# Patient Record
Sex: Female | Born: 1985 | Race: Black or African American | Hispanic: No | Marital: Single | Smoking: Current some day smoker
Health system: Southern US, Community
[De-identification: ages and names within clinical notes are randomized; demographics above are authoritative.]

---

## 2006-08-05 ENCOUNTER — Emergency Department (HOSPITAL_COMMUNITY): Admission: EM | Admit: 2006-08-05 | Discharge: 2006-08-05 | Payer: Self-pay | Admitting: Emergency Medicine

## 2016-06-21 ENCOUNTER — Emergency Department (HOSPITAL_COMMUNITY)
Admission: EM | Admit: 2016-06-21 | Discharge: 2016-06-21 | Disposition: A | Discharge status: Home / Self Care | Payer: Self-pay | Attending: Emergency Medicine | Admitting: Emergency Medicine

## 2016-06-21 ENCOUNTER — Ambulatory Visit (HOSPITAL_COMMUNITY)
Admission: EM | Admit: 2016-06-21 | Discharge: 2016-06-21 | Disposition: A | Discharge status: Nursing Home (Includes ICF) | Payer: Self-pay

## 2016-06-21 ENCOUNTER — Encounter (HOSPITAL_COMMUNITY): Payer: Self-pay | Admitting: Emergency Medicine

## 2016-06-21 DIAGNOSIS — R109 Unspecified abdominal pain: Secondary | ICD-10-CM

## 2016-06-21 DIAGNOSIS — R1084 Generalized abdominal pain: Secondary | ICD-10-CM | POA: Insufficient documentation

## 2016-06-21 LAB — CBC
HEMATOCRIT: 35 % — AB (ref 36.0–46.0)
HEMOGLOBIN: 10.7 g/dL — AB (ref 12.0–15.0)
MCH: 24.5 pg — AB (ref 26.0–34.0)
MCHC: 30.6 g/dL (ref 30.0–36.0)
MCV: 80.3 fL (ref 78.0–100.0)
Platelets: 398 10*3/uL (ref 150–400)
RBC: 4.36 MIL/uL (ref 3.87–5.11)
RDW: 14.8 % (ref 11.5–15.5)
WBC: 7.4 10*3/uL (ref 4.0–10.5)

## 2016-06-21 LAB — COMPREHENSIVE METABOLIC PANEL
ALBUMIN: 4.5 g/dL (ref 3.5–5.0)
ALT: 22 U/L (ref 14–54)
ANION GAP: 10 (ref 5–15)
AST: 22 U/L (ref 15–41)
Alkaline Phosphatase: 48 U/L (ref 38–126)
BUN: 15 mg/dL (ref 6–20)
CHLORIDE: 105 mmol/L (ref 101–111)
CO2: 23 mmol/L (ref 22–32)
Calcium: 9.2 mg/dL (ref 8.9–10.3)
Creatinine, Ser: 0.62 mg/dL (ref 0.44–1.00)
GFR calc Af Amer: >60 mL/min (ref >60)
GFR calc non Af Amer: >60 mL/min (ref >60)
GLUCOSE: 107 mg/dL — AB (ref 65–99)
POTASSIUM: 3.6 mmol/L (ref 3.5–5.1)
SODIUM: 138 mmol/L (ref 135–145)
TOTAL PROTEIN: 7.7 g/dL (ref 6.5–8.1)
Total Bilirubin: 0.9 mg/dL (ref 0.3–1.2)

## 2016-06-21 LAB — I-STAT CHEM 8, ED
BUN: 21 mg/dL — AB (ref 6–20)
CREATININE: 0.6 mg/dL (ref 0.44–1.00)
Calcium, Ion: 1.1 mmol/L — ABNORMAL LOW (ref 1.13–1.30)
Chloride: 104 mmol/L (ref 101–111)
Glucose, Bld: 98 mg/dL (ref 65–99)
HEMATOCRIT: 40 % (ref 36.0–46.0)
Hemoglobin: 13.6 g/dL (ref 12.0–15.0)
Potassium: 4.5 mmol/L (ref 3.5–5.1)
Sodium: 140 mmol/L (ref 135–145)
TCO2: 26 mmol/L (ref 0–100)

## 2016-06-21 LAB — LIPASE, BLOOD: Lipase: 17 U/L (ref 11–51)

## 2016-06-21 MED ORDER — HYDROCODONE-ACETAMINOPHEN 5-325 MG PO TABS
1.0000 | ORAL_TABLET | Freq: Once | ORAL | Status: DC
  Filled 2016-06-21: qty 1

## 2016-06-21 MED ORDER — BISMUTH SUBSALICYLATE 262 MG PO CHEW
524.0000 mg | CHEWABLE_TABLET | ORAL | Status: DC | PRN
  Filled 2016-06-21: qty 2

## 2016-06-21 MED ORDER — ONDANSETRON 4 MG PO TBDP: 4.0000 mg | ORAL_TABLET | Freq: Three times a day (TID) | ORAL | 0 refills | Status: DC | PRN

## 2016-06-21 MED ORDER — ONDANSETRON HCL 8 MG PO TABS: 8.0000 mg | ORAL_TABLET | Freq: Three times a day (TID) | ORAL | 0 refills | Status: DC | PRN

## 2016-06-21 MED ORDER — SODIUM CHLORIDE 0.9 % IV BOLUS (SEPSIS)
1000.0000 mL | Freq: Once | INTRAVENOUS | Status: AC
  Administered 2016-06-21: 1000 mL via INTRAVENOUS

## 2016-06-21 MED ORDER — METRONIDAZOLE IVPB CUSTOM
2.0000 g | Freq: Once | INTRAVENOUS | Status: AC
  Administered 2016-06-21: 2 g via INTRAVENOUS
  Filled 2016-06-21 (×2): qty 400

## 2016-06-21 MED ORDER — IBUPROFEN 800 MG PO TABS: 800.0000 mg | ORAL_TABLET | Freq: Four times a day (QID) | ORAL | 0 refills | Status: DC | PRN

## 2016-06-21 MED ORDER — ACETAMINOPHEN 500 MG PO TABS
1000.0000 mg | ORAL_TABLET | Freq: Once | ORAL | Status: AC
  Administered 2016-06-21: 1000 mg via ORAL
  Filled 2016-06-21: qty 2

## 2016-06-21 MED ORDER — BISMUTH SUBSALICYLATE 262 MG PO CHEW: CHEWABLE_TABLET | ORAL | 0 refills | Status: DC

## 2016-06-21 MED ORDER — ACETAMINOPHEN 500 MG PO TABS: 1000.0000 mg | ORAL_TABLET | Freq: Four times a day (QID) | ORAL | 0 refills | Status: DC | PRN

## 2016-06-21 MED ORDER — KETOROLAC TROMETHAMINE 15 MG/ML IJ SOLN
15.0000 mg | Freq: Once | INTRAMUSCULAR | Status: AC
  Administered 2016-06-21: 15 mg via INTRAVENOUS
  Filled 2016-06-21: qty 1

## 2016-06-21 MED ORDER — ONDANSETRON 4 MG PO TBDP
8.0000 mg | ORAL_TABLET | Freq: Once | ORAL | Status: AC
  Administered 2016-06-21: 8 mg via ORAL
  Filled 2016-06-21: qty 2

## 2016-06-21 NOTE — ED Triage Notes (Signed)
Patient reports abdominal pain Sunday 8/5 and seen at [facility] obtaining labs and ct scan.  Patient reports she was given pepcid and phenergan,  Patient reports she is getting worse, no better at all.  Reports vomiting prior to coming in today.  Last stool was yesterday and diarrhea.    See one pelvic testing from 8/2 positive trich, but patient has not been on any medicines  Denies any urinary symptoms and denies any vaginal discharge.

## 2016-06-21 NOTE — ED Provider Notes (Signed)
[facility] Provider Note   CSN: [id number] Arrival date & time: 06/21/16  1307  First Provider Contact:  First MD Initiated Contact with Patient 06/21/16 1429     History   Chief Complaint Chief Complaint  Patient presents with  . Abdominal Pain    HPI Tammy Patel is a 30 y.o. female.  HPI   Patient presents with abdominal pain. Began four days ago and has been worsening since. Was seen at [facility] ED two days ago for same problem. hCG neg, CT with signs of pelvic venous congestion but no abnormalities otherwise, labs WNL. Patient discharged with Pepcid and phenergan, however symptoms did not improve. She was seen at Urgent Care this AM, and was told to come to [facility] ED.  Reporting about 7 episodes of emesis daily and two episodes of diarrhea. Also endorsing nausea. Unable to tolerate solids, but has been able to drink water without vomiting. Has not tried anything other than phenergan for symptoms. Denies constipation, hematochezia, chest pain, SOB.    Of note, patient was seen by PCP on 8/2 and wet prep at that visit was positive for trichomoniasis. Patient denies knowledge of this and says she did not receive any antibiotics. She denies vaginal discharge, vaginal irritation, hematuria.   History reviewed. No pertinent past medical history.  There are no active problems to display for this patient.  Past Surgical History:  Procedure Laterality Date  . CESAREAN SECTION      OB History    No data available      Home Medications    Prior to Admission medications   Medication Sig Start Date End Date Taking? Authorizing Provider  promethazine (PHENERGAN) 25 MG tablet Take 25 mg by mouth every 6 (six) hours as needed for nausea or vomiting.   Yes Historical Provider, MD  acetaminophen (TYLENOL) 500 MG tablet Take 2 tablets (1,000 mg total) by mouth every 6 (six) hours as needed. 06/21/16   Marquette Saa, MD  bismuth subsalicylate (PEPTO-BISMOL) 262 MG  chewable tablet Chew 2 tablets (524 mg total) by mouth as needed for diarrhea/loose stools. 06/21/16   Marquette Saa, MD  ibuprofen (ADVIL,MOTRIN) 800 MG tablet Take 1 tablet (800 mg total) by mouth every 6 (six) hours as needed. 06/21/16   Marquette Saa, MD  ondansetron (ZOFRAN ODT) 4 MG disintegrating tablet Take 1 tablet (4 mg total) by mouth every 8 (eight) hours as needed for nausea or vomiting. 06/21/16   Marquette Saa, MD    Family History No family history on file.  Social History Social History  Substance Use Topics  . Smoking status: Never Smoker  . Smokeless tobacco: Never Used  . Alcohol use Yes     Allergies   Review of patient's allergies indicates no known allergies.   Review of Systems Review of Systems  Constitutional: Positive for chills. Negative for appetite change and fever.  HENT: Negative for congestion and rhinorrhea.   Respiratory: Negative for chest tightness and shortness of breath.   Cardiovascular: Negative for chest pain.  Gastrointestinal: Positive for abdominal pain, diarrhea, nausea and vomiting. Negative for blood in stool and constipation.  Genitourinary: Negative for hematuria, pelvic pain, vaginal discharge and vaginal pain.  Allergic/Immunologic: Negative for immunocompromised state.  Neurological: Positive for dizziness and weakness.    Physical Exam Updated Vital Signs BP 142/89   Pulse 85   Temp 98 F (36.7 C) (Oral)   Resp 18   Ht 5' (1.524 m)  Wt 49.9 kg   LMP 06/17/2016   SpO2 100%   BMI 21.48 kg/m   Physical Exam  Constitutional: She is oriented to person, place, and time.  Lying in bed in mild distress  HENT:  Head: Normocephalic and atraumatic.  Nose: Nose normal.  Mouth/Throat: No oropharyngeal exudate.  Dry MM, cracked lips  Eyes: Conjunctivae and EOM are normal. Pupils are equal, round, and reactive to light. Right eye exhibits no discharge. Left eye exhibits no discharge. No scleral  icterus.  Neck: Normal range of motion. Neck supple.  Cardiovascular: Normal rate, regular rhythm, normal heart sounds and intact distal pulses.   No murmur heard. Pulmonary/Chest: Effort normal and breath sounds normal. No respiratory distress. She has no wheezes.  Abdominal: Soft. Bowel sounds are normal. She exhibits no distension and no mass. There is tenderness (Diffuse). There is guarding. There is no rebound.  Musculoskeletal: She exhibits no edema or tenderness.  Lymphadenopathy:    She has no cervical adenopathy.  Neurological: She is alert and oriented to person, place, and time.  Skin: Skin is warm and dry.  Psychiatric: Her behavior is normal.  Flat affect   ED Treatments / Results  Labs (all labs ordered are listed, but only abnormal results are displayed) Labs Reviewed  COMPREHENSIVE METABOLIC PANEL - Abnormal; Notable for the following:       Result Value   Glucose, Bld 107 (*)    All other components within normal limits  CBC - Abnormal; Notable for the following:    Hemoglobin 10.7 (*)    HCT 35.0 (*)    MCH 24.5 (*)    All other components within normal limits  I-STAT CHEM 8, ED - Abnormal; Notable for the following:    BUN 21 (*)    Calcium, Ion 1.10 (*)    All other components within normal limits  LIPASE, BLOOD  URINALYSIS, ROUTINE W REFLEX MICROSCOPIC (NOT AT [facility])    EKG  EKG Interpretation None       Radiology No results found.  Procedures Procedures (including critical care time)  Medications Ordered in ED Medications  bismuth subsalicylate (PEPTO BISMOL) chewable tablet 524 mg (not administered)  metroNIDAZOLE (FLAGYL) IVPB 2 g (2 g Intravenous New Bag/Given 06/21/16 1529)  ondansetron (ZOFRAN-ODT) disintegrating tablet 8 mg (8 mg Oral Given 06/21/16 1455)  sodium chloride 0.9 % bolus 1,000 mL (1,000 mLs Intravenous New Bag/Given 06/21/16 1528)  ketorolac (TORADOL) 15 MG/ML injection 15 mg (15 mg Intravenous Given 06/21/16 1527)  acetaminophen  (TYLENOL) tablet 1,000 mg (1,000 mg Oral Given 06/21/16 1528)    Initial Impression / Assessment and Plan / ED Course  I have reviewed the triage vital signs and the nursing notes.  Pertinent labs & imaging results that were available during my care of the patient were reviewed by me and considered in my medical decision making (see chart for details).  Clinical Course   - Given Zofran ODT, Pepto Bismol, Toradol, and Tylenol with some improvement in symptoms. Also given Flagyl to treat trichomoniasis.   Final Clinical Impressions(s) / ED Diagnoses   Final diagnoses:  Generalized abdominal pain   Patient presenting with diffuse abdominal pain. As patient with CT negative for acute abnormalities performed two days ago, no repeat imaging today. Beta-hCG negative two days ago as well, so not repeated today. Given vomiting, diarrhea, and chills, likely 2/2 gastroenteritis. Improved with Zofran, Pepto Bismol, and NSAIDs. Discharged with Zofran ODT, Pepto Bismol, Tylenol and ibuprofen, and instructed to drink  as much fluids as possible.   New Prescriptions New Prescriptions   ACETAMINOPHEN (TYLENOL) 500 MG TABLET    Take 2 tablets (1,000 mg total) by mouth every 6 (six) hours as needed.   BISMUTH SUBSALICYLATE (PEPTO-BISMOL) 262 MG CHEWABLE TABLET    Chew 2 tablets (524 mg total) by mouth as needed for diarrhea/loose stools.   IBUPROFEN (ADVIL,MOTRIN) 800 MG TABLET    Take 1 tablet (800 mg total) by mouth every 6 (six) hours as needed.   ONDANSETRON (ZOFRAN ODT) 4 MG DISINTEGRATING TABLET    Take 1 tablet (4 mg total) by mouth every 8 (eight) hours as needed for nausea or vomiting.     Marquette Saa, MD 06/21/16 1626    Lyndal Pulley, MD 06/21/16 2152

## 2016-06-21 NOTE — ED Notes (Signed)
Pt reports that she still feels bad

## 2016-06-21 NOTE — ED Provider Notes (Addendum)
[facility]    CSN: [id number] Arrival date & time: 06/21/16  1159  First Provider Contact:  First MD Initiated Contact with Patient 06/21/16 1239        History   Chief Complaint No chief complaint on file.   HPI Tammy Patel is a 30 y.o. female.    Abdominal Pain  Pain location:  Generalized Pain quality: sharp   Pain radiates to:  Does not radiate Pain severity:  Moderate Onset quality:  Gradual Timing:  Constant Progression:  Worsening Chronicity:  New Context comment:  Seen at [facility] on mon, ct and lab neg, given pepcid and phenergan with no relief, constant nausea and vomiting episodes, no fever, pains getting worse., denies vaginal or menstrual sx. no urinary sx. Associated symptoms: diarrhea, nausea and vomiting   Associated symptoms: no dysuria, no fever, no hematochezia, no vaginal bleeding and no vaginal discharge   Risk factors: not pregnant     No past medical history on file.  There are no active problems to display for this patient.   No past surgical history on file.  OB History    No data available       Home Medications    Prior to Admission medications   Not on File    Family History No family history on file.  Social History Social History  Substance Use Topics  . Smoking status: Not on file  . Smokeless tobacco: Not on file  . Alcohol use Not on file     Allergies   Review of patient's allergies indicates not on file.   Review of Systems Review of Systems  Constitutional: Negative.  Negative for fever.  HENT: Negative.   Respiratory: Negative.   Cardiovascular: Negative.   Gastrointestinal: Positive for abdominal pain, diarrhea, nausea and vomiting. Negative for hematochezia.  Genitourinary: Negative for dysuria, vaginal bleeding and vaginal discharge.  All other systems reviewed and are negative.    Physical Exam Triage Vital Signs ED Triage Vitals [06/21/16 1226]  Enc Vitals Group     BP  127/68     Pulse Rate 89     Resp 12     Temp 98.1 F (36.7 C)     Temp Source Oral     SpO2 97 %     Weight      Height      Head Circumference      Peak Flow      Pain Score      Pain Loc      Pain Edu?      Excl. in GC?    No data found.   Updated Vital Signs BP 127/68 (BP Location: Left Arm)   Pulse 89   Temp 98.1 F (36.7 C) (Oral)   Resp 12   SpO2 97%   Visual Acuity Right Eye Distance:   Left Eye Distance:   Bilateral Distance:    Right Eye Near:   Left Eye Near:    Bilateral Near:     Physical Exam  Constitutional: She is oriented to person, place, and time. She appears well-developed and well-nourished. She appears distressed.  Cardiovascular: Normal rate and regular rhythm.   Pulmonary/Chest: Effort normal and breath sounds normal.  Abdominal: Soft. Normal appearance. She exhibits no mass. Bowel sounds are decreased. There is generalized tenderness and tenderness in the right lower quadrant, epigastric area, periumbilical area and left lower quadrant. There is no rigidity, no rebound, no guarding and no CVA tenderness. No  hernia.  Lymphadenopathy:    She has no cervical adenopathy.  Neurological: She is alert and oriented to person, place, and time.  Skin: Skin is warm and dry.  Nursing note and vitals reviewed.    UC Treatments / Results  Labs (all labs ordered are listed, but only abnormal results are displayed) Labs Reviewed - No data to display  EKG  EKG Interpretation None       Radiology No results found.  Procedures Procedures (including critical care time)  Medications Ordered in UC Medications - No data to display   Initial Impression / Assessment and Plan / UC Course  I have reviewed the triage vital signs and the nursing notes.  Pertinent labs & imaging results that were available during my care of the patient were reviewed by me and considered in my medical decision making (see chart for details).  Clinical Course    sent for eval of gen abd pain, seen at [facility] with full neg w/u on mon, appears in mod discomfort.    Final Clinical Impressions(s) / UC Diagnoses   Final diagnoses:  Abdominal pain in female patient    New Prescriptions New Prescriptions   No medications on file     Linna HoffJames D Lulu Hirschmann, MD 06/21/16 1258    Linna HoffJames D Aaliyha Mumford, MD 06/21/16 1300

## 2016-06-21 NOTE — ED Notes (Signed)
PT comfortable with d/c and f/u instructions. Rx x1 

## 2016-06-21 NOTE — ED Triage Notes (Addendum)
N/v/d since Sunday , lmp last day was Monday  Lower abd pain also since Sunday , [facility] and given meds not helping , phenergan and pepcid

## 2016-06-21 NOTE — ED Notes (Signed)
[facility] by Selena BattenKim RN sts patient c/o abdominal pain n/v/d. [facility] with negative CT scan. Pt in NAD.

## 2016-06-21 NOTE — ED Notes (Signed)
Waiting for   Iv fluid to infuse

## 2016-06-21 NOTE — Discharge Instructions (Signed)
For your abdominal pain, you can alternate between ibuprofen 800 mg and Tylenol 1000 mg (two tablets) every 4-6 hours as needed.   If you are nauseated or vomiting, you can take one Zofran tablet every 8 hours as needed.   For diarrhea or loose stools, you can take Pepto-Bismol as needed.

## 2016-06-21 NOTE — ED Notes (Signed)
Report given to Ricard DillonElizabeth, rn at first nurse in emergency department

## 2016-06-22 LAB — CBG MONITORING, ED: GLUCOSE-CAPILLARY: 108 mg/dL — AB (ref 65–99)

## 2017-01-30 ENCOUNTER — Emergency Department (HOSPITAL_COMMUNITY)
Admission: EM | Admit: 2017-01-30 | Discharge: 2017-01-30 | Disposition: A | Discharge status: Home / Self Care | Payer: Self-pay | Attending: Emergency Medicine | Admitting: Emergency Medicine

## 2017-01-30 ENCOUNTER — Encounter (HOSPITAL_COMMUNITY): Payer: Self-pay | Admitting: *Deleted

## 2017-01-30 ENCOUNTER — Emergency Department (HOSPITAL_COMMUNITY): Payer: Self-pay

## 2017-01-30 DIAGNOSIS — R109 Unspecified abdominal pain: Secondary | ICD-10-CM

## 2017-01-30 DIAGNOSIS — R1012 Left upper quadrant pain: Secondary | ICD-10-CM | POA: Insufficient documentation

## 2017-01-30 DIAGNOSIS — R112 Nausea with vomiting, unspecified: Secondary | ICD-10-CM | POA: Insufficient documentation

## 2017-01-30 DIAGNOSIS — Z79899 Other long term (current) drug therapy: Secondary | ICD-10-CM | POA: Insufficient documentation

## 2017-01-30 DIAGNOSIS — F17200 Nicotine dependence, unspecified, uncomplicated: Secondary | ICD-10-CM | POA: Insufficient documentation

## 2017-01-30 LAB — COMPREHENSIVE METABOLIC PANEL
ALBUMIN: 3.9 g/dL (ref 3.5–5.0)
ALT: 27 U/L (ref 14–54)
AST: 31 U/L (ref 15–41)
Alkaline Phosphatase: 49 U/L (ref 38–126)
Anion gap: 13 (ref 5–15)
BUN: 6 mg/dL (ref 6–20)
CHLORIDE: 101 mmol/L (ref 101–111)
CO2: 25 mmol/L (ref 22–32)
Calcium: 9 mg/dL (ref 8.9–10.3)
Creatinine, Ser: 0.66 mg/dL (ref 0.44–1.00)
GFR calc Af Amer: >60 mL/min (ref >60)
GFR calc non Af Amer: >60 mL/min (ref >60)
GLUCOSE: 111 mg/dL — AB (ref 65–99)
POTASSIUM: 3 mmol/L — AB (ref 3.5–5.1)
SODIUM: 139 mmol/L (ref 135–145)
Total Bilirubin: 0.5 mg/dL (ref 0.3–1.2)
Total Protein: 6.6 g/dL (ref 6.5–8.1)

## 2017-01-30 LAB — URINALYSIS, ROUTINE W REFLEX MICROSCOPIC
BILIRUBIN URINE: NEGATIVE
Glucose, UA: NEGATIVE mg/dL
Hgb urine dipstick: NEGATIVE
KETONES UR: 5 mg/dL — AB
LEUKOCYTES UA: NEGATIVE
Nitrite: NEGATIVE
PH: 8 (ref 5.0–8.0)
Protein, ur: NEGATIVE mg/dL
SPECIFIC GRAVITY, URINE: 1.011 (ref 1.005–1.030)

## 2017-01-30 LAB — CBC
HEMATOCRIT: 30.6 % — AB (ref 36.0–46.0)
HEMOGLOBIN: 9.3 g/dL — AB (ref 12.0–15.0)
MCH: 24.1 pg — ABNORMAL LOW (ref 26.0–34.0)
MCHC: 30.4 g/dL (ref 30.0–36.0)
MCV: 79.3 fL (ref 78.0–100.0)
Platelets: 423 10*3/uL — ABNORMAL HIGH (ref 150–400)
RBC: 3.86 MIL/uL — ABNORMAL LOW (ref 3.87–5.11)
RDW: 14.7 % (ref 11.5–15.5)
WBC: 7.2 10*3/uL (ref 4.0–10.5)

## 2017-01-30 LAB — LIPASE, BLOOD: LIPASE: 15 U/L (ref 11–51)

## 2017-01-30 LAB — RAPID URINE DRUG SCREEN, HOSP PERFORMED
Amphetamines: NOT DETECTED
BARBITURATES: NOT DETECTED
Benzodiazepines: NOT DETECTED
COCAINE: NOT DETECTED
Opiates: NOT DETECTED
TETRAHYDROCANNABINOL: POSITIVE — AB

## 2017-01-30 LAB — PREGNANCY, URINE: Preg Test, Ur: NEGATIVE

## 2017-01-30 LAB — POC URINE PREG, ED: Preg Test, Ur: NEGATIVE

## 2017-01-30 MED ORDER — ONDANSETRON HCL 4 MG/2ML IJ SOLN
4.0000 mg | Freq: Once | INTRAMUSCULAR | Status: AC
  Administered 2017-01-30: 4 mg via INTRAVENOUS
  Filled 2017-01-30: qty 2

## 2017-01-30 MED ORDER — POTASSIUM CHLORIDE 20 MEQ/15ML (10%) PO SOLN: 20.0000 meq | Freq: Every day | ORAL | 0 refills | Status: DC

## 2017-01-30 MED ORDER — SODIUM CHLORIDE 0.9 % IV BOLUS (SEPSIS)
1000.0000 mL | Freq: Once | INTRAVENOUS | Status: AC
  Administered 2017-01-30: 1000 mL via INTRAVENOUS

## 2017-01-30 MED ORDER — IOPAMIDOL (ISOVUE-300) INJECTION 61%
INTRAVENOUS | Status: AC
  Administered 2017-01-30: 100 mL
  Filled 2017-01-30: qty 100

## 2017-01-30 MED ORDER — FENTANYL CITRATE (PF) 100 MCG/2ML IJ SOLN
100.0000 ug | INTRAMUSCULAR | Status: DC | PRN
  Administered 2017-01-30: 100 ug via INTRAVENOUS
  Filled 2017-01-30: qty 2

## 2017-01-30 MED ORDER — ONDANSETRON HCL 8 MG PO TABS: 8.0000 mg | ORAL_TABLET | Freq: Three times a day (TID) | ORAL | 0 refills | Status: AC | PRN

## 2017-01-30 NOTE — ED Notes (Signed)
UDS added on with lab.

## 2017-01-30 NOTE — ED Notes (Signed)
ED Provider at bedside. Explaining discharge instructions.

## 2017-01-30 NOTE — ED Triage Notes (Signed)
c/o nausea and vomiting for several weeks has been seen several times in different ED's for same

## 2017-01-30 NOTE — ED Notes (Signed)
Pt verbalized understanding of discharge instructions and denies any further questions at this time.   

## 2017-01-30 NOTE — ED Provider Notes (Signed)
[facility] Provider Note   CSN: [id number] Arrival date & time: 01/30/17  0720     History   Chief Complaint Chief Complaint  Patient presents with  . Emesis    HPI Tammy Patel is a 31 y.o. female.  She is here for evaluation of nausea vomiting with diarrhea for about 10 days.  She was previously evaluated, with labs and treated symptomatically, without improvement.  She has been using both Zofran and Phenergan, intermittently.  She denies fever cough weakness or dizziness.  No prior similar problem.  There are no other known modifying factors.  HPI  History reviewed. No pertinent past medical history.  There are no active problems to display for this patient.   Past Surgical History:  Procedure Laterality Date  . CESAREAN SECTION      OB History    Gravida Para Term Preterm AB Living   4 4       4    SAB TAB Ectopic Multiple Live Births                   Home Medications    Prior to Admission medications   Medication Sig Start Date End Date Taking? Authorizing Provider  famotidine (PEPCID) 20 MG tablet Take 20 mg by mouth 2 (two) times daily.   Yes Historical Provider, MD  promethazine (PHENERGAN) 25 MG tablet Take 25 mg by mouth every 6 (six) hours as needed for nausea or vomiting.   Yes Historical Provider, MD  acetaminophen (TYLENOL) 500 MG tablet Take 2 tablets (1,000 mg total) by mouth every 6 (six) hours as needed. Patient not taking: Reported on 01/30/2017 06/21/16   Marquette Saa, MD  bismuth subsalicylate (PEPTO-BISMOL) 262 MG chewable tablet Chew 2 tablets (524 mg total) by mouth as needed for diarrhea/loose stools. Patient not taking: Reported on 01/30/2017 06/21/16   Marquette Saa, MD  ibuprofen (ADVIL,MOTRIN) 800 MG tablet Take 1 tablet (800 mg total) by mouth every 6 (six) hours as needed. Patient not taking: Reported on 01/30/2017 06/21/16   Marquette Saa, MD  ondansetron (ZOFRAN ODT) 4 MG disintegrating tablet  Take 1 tablet (4 mg total) by mouth every 8 (eight) hours as needed for nausea or vomiting. Patient not taking: Reported on 01/30/2017 06/21/16   Marquette Saa, MD  ondansetron [facility]) 8 MG tablet Take 1 tablet (8 mg total) by mouth every 8 (eight) hours as needed for nausea or vomiting. 01/30/17   Mancel Bale, MD    Family History History reviewed. No pertinent family history.  Social History Social History  Substance Use Topics  . Smoking status: Current Some Day Smoker  . Smokeless tobacco: Never Used  . Alcohol use No     Allergies   Patient has no known allergies.   Review of Systems Review of Systems  All other systems reviewed and are negative.    Physical Exam Updated Vital Signs BP (!) 158/104 (BP Location: Left Arm)   Pulse 89   Temp 98.4 F (36.9 C) (Oral)   Resp 16   LMP 01/22/2017   SpO2 100%   Physical Exam  Constitutional: She is oriented to person, place, and time. She appears well-developed and well-nourished.  HENT:  Head: Normocephalic and atraumatic.  Eyes: Conjunctivae and EOM are normal. Pupils are equal, round, and reactive to light.  Neck: Normal range of motion and phonation normal. Neck supple.  Cardiovascular: Normal rate and regular rhythm.   Pulmonary/Chest: Effort normal and breath sounds  normal. She exhibits no tenderness.  Abdominal: Soft. She exhibits no distension and no mass. There is tenderness (Tender left upper lower quadrants, mild). There is no rebound and no guarding. No hernia.  Musculoskeletal: Normal range of motion.  Neurological: She is alert and oriented to person, place, and time. She exhibits normal muscle tone.  Skin: Skin is warm and dry.  Psychiatric: She has a normal mood and affect. Her behavior is normal. Judgment and thought content normal.  Nursing note and vitals reviewed.    ED Treatments / Results  Labs (all labs ordered are listed, but only abnormal results are displayed) Labs Reviewed    COMPREHENSIVE METABOLIC PANEL - Abnormal; Notable for the following:       Result Value   Potassium 3.0 (*)    Glucose, Bld 111 (*)    All other components within normal limits  CBC - Abnormal; Notable for the following:    RBC 3.86 (*)    Hemoglobin 9.3 (*)    HCT 30.6 (*)    MCH 24.1 (*)    Platelets 423 (*)    All other components within normal limits  URINALYSIS, ROUTINE W REFLEX MICROSCOPIC - Abnormal; Notable for the following:    Color, Urine STRAW (*)    Ketones, ur 5 (*)    All other components within normal limits  RAPID URINE DRUG SCREEN, HOSP PERFORMED - Abnormal; Notable for the following:    Tetrahydrocannabinol POSITIVE (*)    All other components within normal limits  LIPASE, BLOOD  PREGNANCY, URINE  POC URINE PREG, ED    EKG  EKG Interpretation None       Radiology Ct Abdomen Pelvis W Contrast  Result Date: 01/30/2017 CLINICAL DATA:  31 year old female with nausea and vomiting for several weeks. EXAM: CT ABDOMEN AND PELVIS WITH CONTRAST TECHNIQUE: Multidetector CT imaging of the abdomen and pelvis was performed using the standard protocol following bolus administration of intravenous contrast. CONTRAST:  100mL ISOVUE-300 IOPAMIDOL (ISOVUE-300) INJECTION 61% COMPARISON:  CT the abdomen and pelvis 06/19/2017. FINDINGS: Lower chest: Unremarkable. Hepatobiliary: No cystic or solid hepatic lesions. No intra or extrahepatic biliary ductal dilatation. Gallbladder is normal in appearance. Pancreas: No pancreatic mass. No pancreatic ductal dilatation. No pancreatic or peripancreatic fluid or inflammatory changes. Spleen: Unremarkable. Adrenals/Urinary Tract: Bilateral adrenal glands and bilateral kidneys are normal in appearance. There is no hydroureteronephrosis. Urinary bladder is normal in appearance. Stomach/Bowel: Normal appearance of the stomach. No pathologic dilatation of small bowel or colon. Normal appendix. Vascular/Lymphatic: No significant atherosclerotic  disease, aneurysm or dissection identified in the abdominal or pelvic vasculature. No lymphadenopathy noted in the abdomen or pelvis. Reproductive: Uterus and ovaries are unremarkable in appearance. Other: Trace volume of free fluid noted in the cul-de-sac, presumably physiologic in this young female patient. No larger volume of ascites. No pneumoperitoneum. Musculoskeletal: There are no aggressive appearing lytic or blastic lesions noted in the visualized portions of the skeleton. IMPRESSION: 1. No acute findings are noted in the abdomen or pelvis. 2. Normal appendix. 3. Trace volume of free fluid in the cul-de-sac, presumably physiologic in this young female patient. Electronically Signed   By: Trudie Reedaniel  Entrikin M.D.   On: 01/30/2017 12:38    Procedures Procedures (including critical care time)  Medications Ordered in ED Medications  sodium chloride 0.9 % bolus 1,000 mL (0 mLs Intravenous Stopped 01/30/17 1055)  ondansetron (ZOFRAN) injection 4 mg (4 mg Intravenous Given 01/30/17 1006)  iopamidol (ISOVUE-300) 61 % injection (100 mLs  Contrast  Given 01/30/17 1214)     Initial Impression / Assessment and Plan / ED Course  I have reviewed the triage vital signs and the nursing notes.  Pertinent labs & imaging results that were available during my care of the patient were reviewed by me and considered in my medical decision making (see chart for details).  Clinical Course as of Jan 31 1743  Tue Jan 30, 2017  0918 Ongoing nausea vomiting with limited diarrhea for several weeks.  Previous evaluation at [facility] emergency department, 2.  No better with antiemetics.  [EW]    Clinical Course User Index [EW] Mancel Bale, MD    Medications  sodium chloride 0.9 % bolus 1,000 mL (0 mLs Intravenous Stopped 01/30/17 1055)  ondansetron (ZOFRAN) injection 4 mg (4 mg Intravenous Given 01/30/17 1006)  iopamidol (ISOVUE-300) 61 % injection (100 mLs  Contrast Given 01/30/17 1214)    Patient  Vitals for the past 24 hrs:  BP Temp Temp src Pulse Resp SpO2  01/30/17 1233 (!) 158/104 98.4 F (36.9 C) Oral 89 16 100 %  01/30/17 0947 (!) 148/102 - - 92 16 100 %  01/30/17 0724 (!) 153/101 98.2 F (36.8 C) Oral 87 16 100 %    At discharge- reevaluation with update and discussion. After initial assessment and treatment, an updated evaluation reveals she is comfortable and has no further complaints.  Findings discussed with patient, all questions answered. Cru Kritikos L    Final Clinical Impressions(s) / ED Diagnoses   Final diagnoses:  Non-intractable vomiting with nausea, unspecified vomiting type  Abdominal pain, unspecified abdominal location    Nonspecific ongoing nausea vomiting and diarrhea, nonspecific, without significant abnormal findings.  Urine drug screen positive for THC.  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  Mild blood pressure elevation is likely secondary to discomfort.  Doubt hypertensive urgency.  Doubt impending vascular clamps were metabolic instability.  She will need evaluation by gastroenterology for definitive diagnosis.  Nursing Notes Reviewed/ Care Coordinated Applicable Imaging Reviewed Interpretation of Laboratory Data incorporated into ED treatment  The patient appears reasonably screened and/or stabilized for discharge and I doubt any other medical condition or other [facility] requiring further screening, evaluation, or treatment in the ED at this time prior to discharge.  Plan: Home Medications-resume Zofran or Phenergan as needed for nausea vomiting; Home Treatments-rest, fluids gradually advance diet; return here if the recommended treatment, does not improve the symptoms; Recommended follow up-gastroenterology checkup 1 week   New Prescriptions Discharge Medication List as of 01/30/2017  2:04 PM       Mancel Bale, MD 01/30/17 1747

## 2017-01-30 NOTE — ED Notes (Signed)
Patient transported to CT 

## 2017-11-20 IMAGING — CT CT ABD-PELV W/ CM
2 of 4 series · 16 of 46 positions shown, 18 images · IV contrast (Omni 300)
Comparison: CT the abdomen and pelvis 06/19/2017.

CLINICAL DATA: 30-year-old female with nausea and vomiting for
several weeks.

EXAM:
CT ABDOMEN AND PELVIS WITH CONTRAST
TECHNIQUE: Multidetector CT imaging of the abdomen and pelvis was performed
using the standard protocol following bolus administration of
intravenous contrast.
CONTRAST:  100mL DZJU56-JLL IOPAMIDOL (DZJU56-JLL) INJECTION 61%

[Series 3: a/p w/ 5mm · axial · 0.62mm/px · z∈[-996,-621]mm · 13 of 83 slices shown, 15 images]
[im 4/83  soft-tissue]
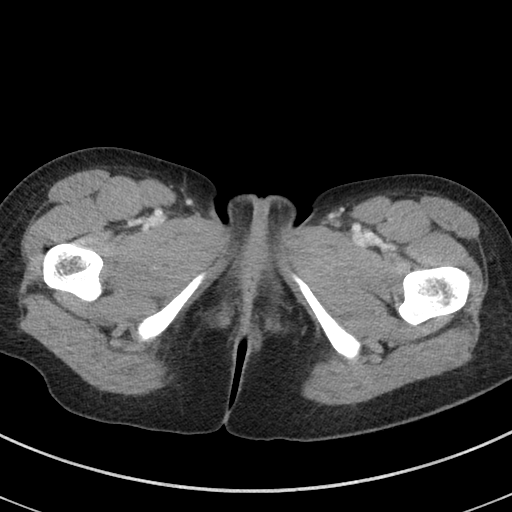
[im 4/83  bone]
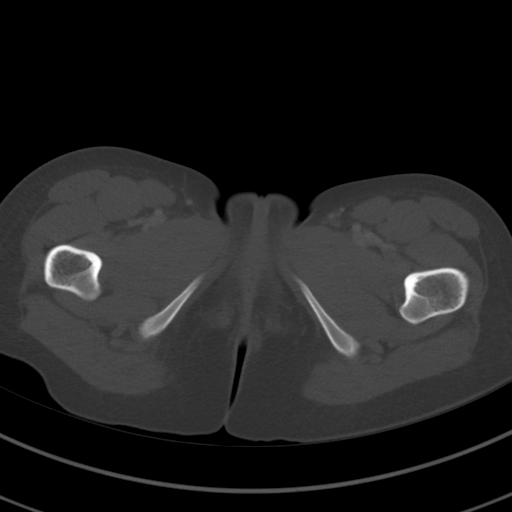
[im 11/83  soft-tissue]
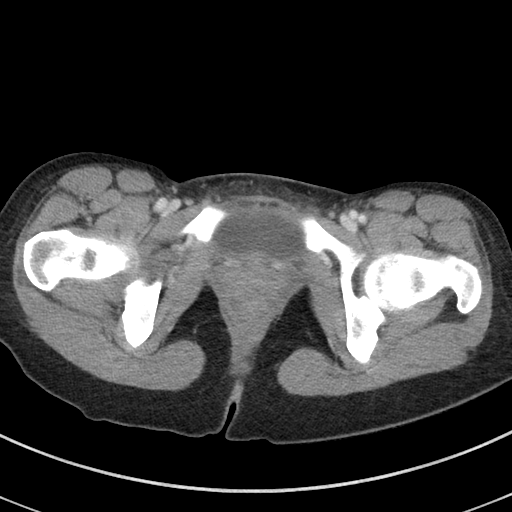
[im 18/83  soft-tissue]
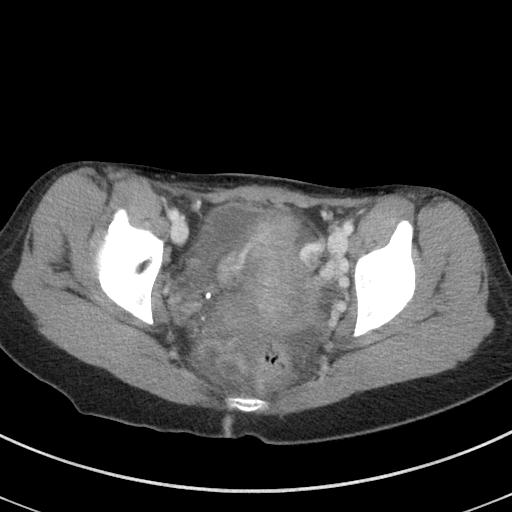
[im 22/83  soft-tissue]
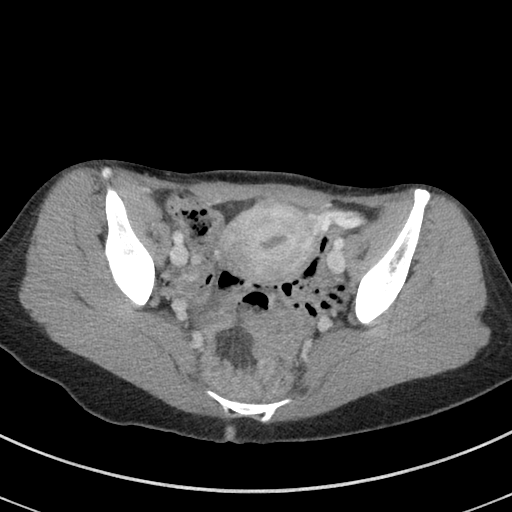
[im 29/83  soft-tissue]
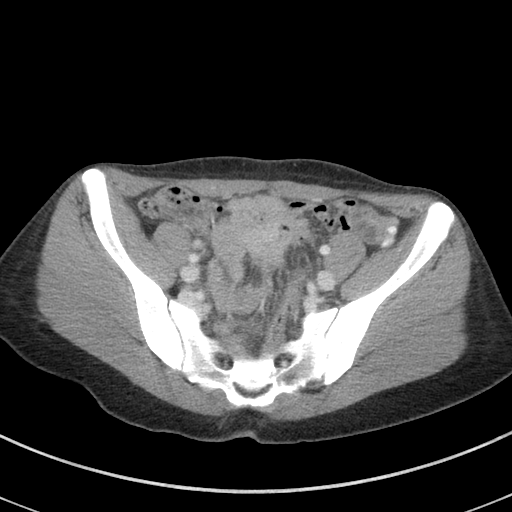
[im 36/83  soft-tissue]
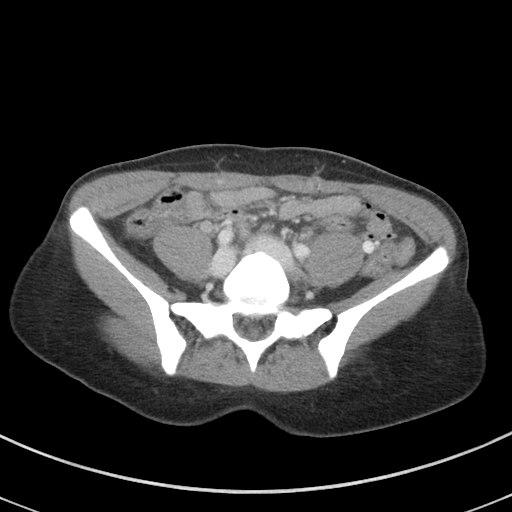
[im 43/83  soft-tissue]
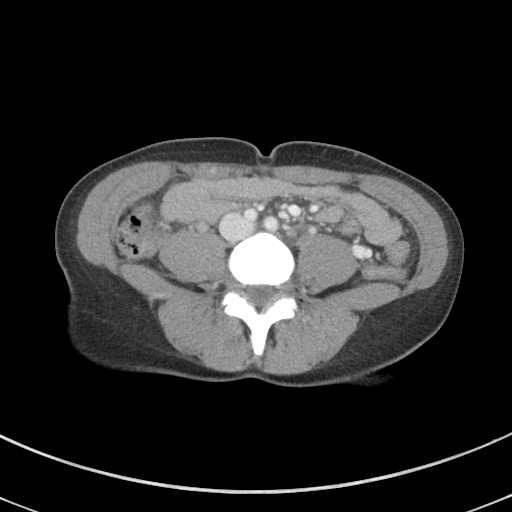
[im 47/83  soft-tissue]
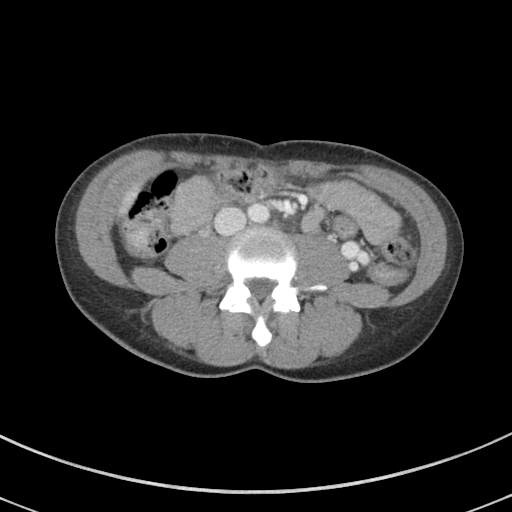
[im 54/83  soft-tissue]
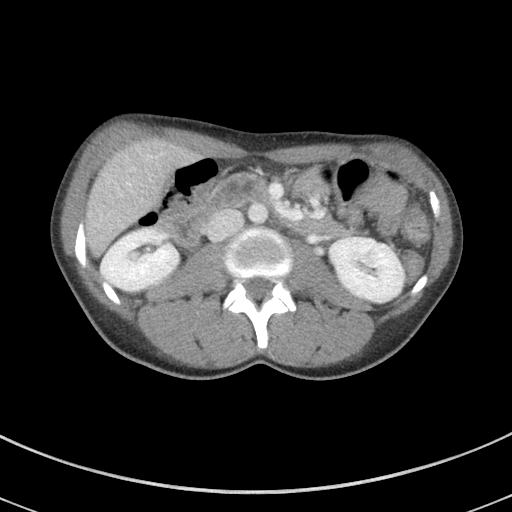
[im 54/83  bone]
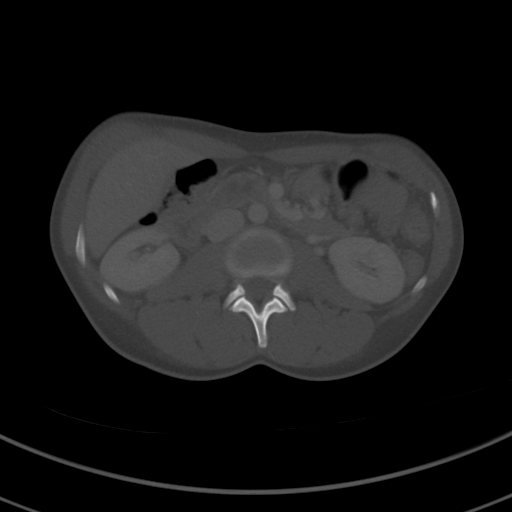
[im 61/83  soft-tissue]
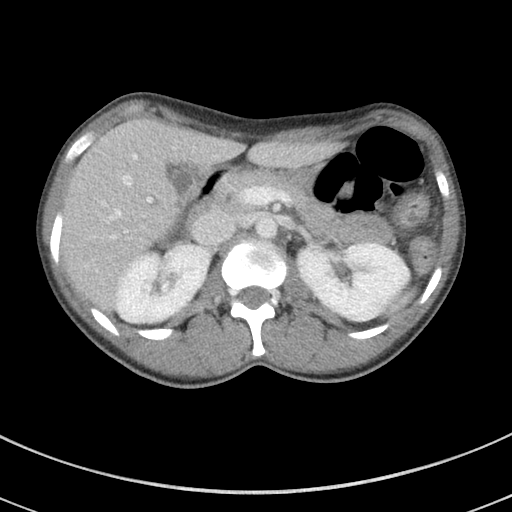
[im 65/83  soft-tissue]
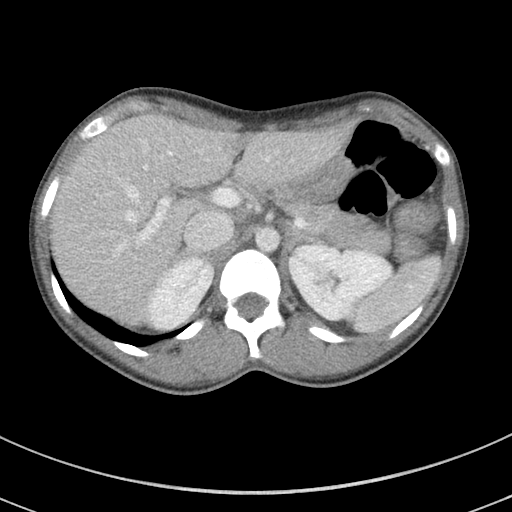
[im 72/83  soft-tissue]
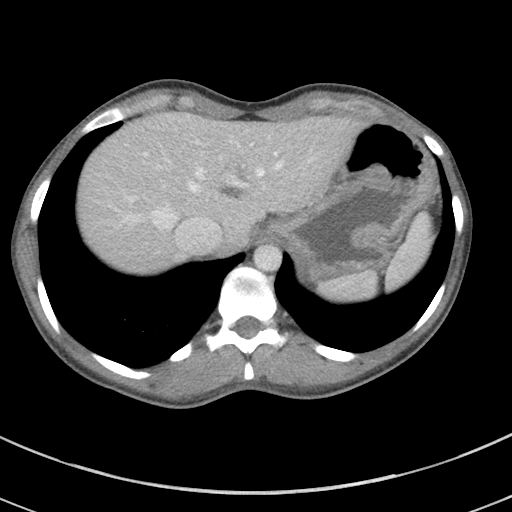
[im 79/83  soft-tissue]
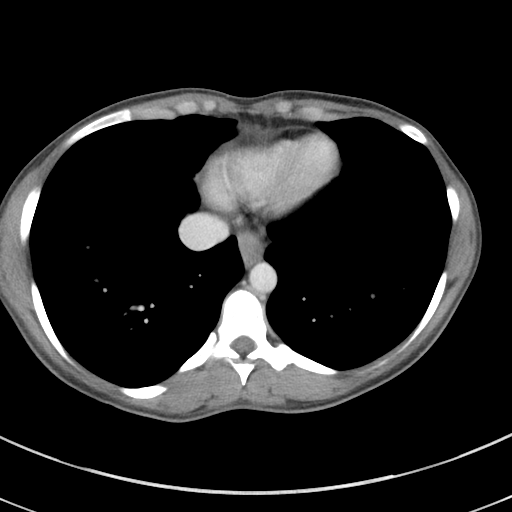

[Series 6: a/p w/ cor · coronal · 0.69mm/px · 3 of 105 slices shown]
[im 35/105  soft-tissue]
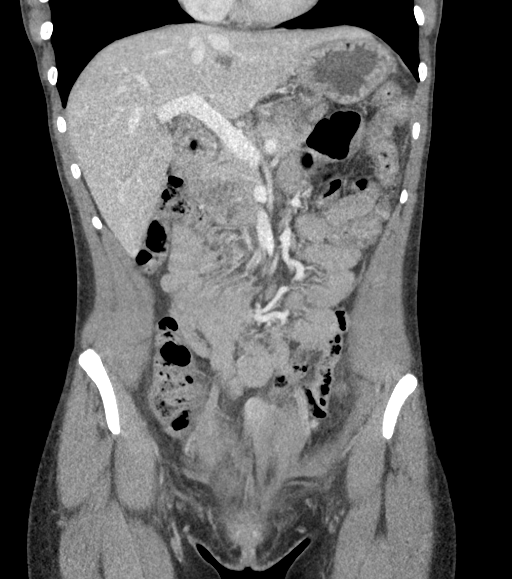
[im 47/105  soft-tissue]
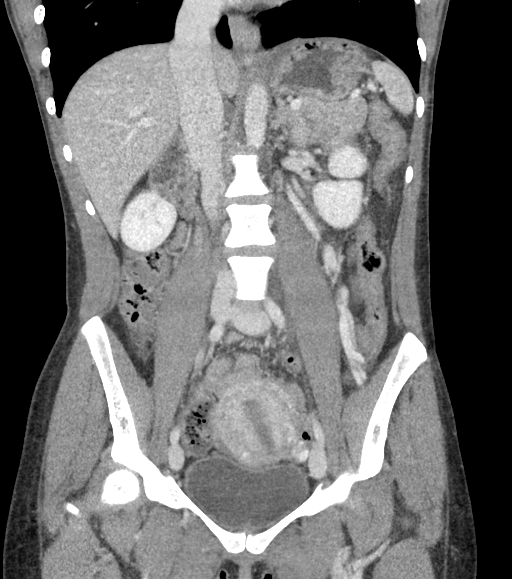
[im 58/105  soft-tissue]
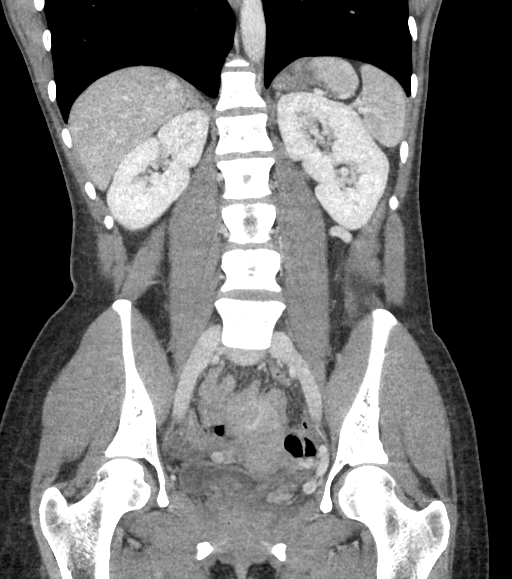

[16 of 46 positions shown; findings below may reference images not displayed]

FINDINGS: Lower chest: Unremarkable.

Hepatobiliary: No cystic or solid hepatic lesions. No intra or
extrahepatic biliary ductal dilatation. Gallbladder is normal in
appearance.

Pancreas: No pancreatic mass. No pancreatic ductal dilatation. No
pancreatic or peripancreatic fluid or inflammatory changes.

Spleen: Unremarkable.

Adrenals/Urinary Tract: Bilateral adrenal glands and bilateral
kidneys are normal in appearance. There is no hydroureteronephrosis.
Urinary bladder is normal in appearance.

Stomach/Bowel: Normal appearance of the stomach. No pathologic
dilatation of small bowel or colon. Normal appendix.

Vascular/Lymphatic: No significant atherosclerotic disease, aneurysm
or dissection identified in the abdominal or pelvic vasculature. No
lymphadenopathy noted in the abdomen or pelvis.

Reproductive: Uterus and ovaries are unremarkable in appearance.

Other: Trace volume of free fluid noted in the cul-de-sac,
presumably physiologic in this young female patient. No larger
volume of ascites. No pneumoperitoneum.

Musculoskeletal: There are no aggressive appearing lytic or blastic
lesions noted in the visualized portions of the skeleton.
IMPRESSION: 1. No acute findings are noted in the abdomen or pelvis.
2. Normal appendix.
3. Trace volume of free fluid in the cul-de-sac, presumably
physiologic in this young female patient.

## 2017-12-16 ENCOUNTER — Encounter (HOSPITAL_BASED_OUTPATIENT_CLINIC_OR_DEPARTMENT_OTHER): Payer: Self-pay | Admitting: Emergency Medicine

## 2017-12-16 ENCOUNTER — Other Ambulatory Visit: Payer: Self-pay

## 2017-12-16 ENCOUNTER — Emergency Department (HOSPITAL_BASED_OUTPATIENT_CLINIC_OR_DEPARTMENT_OTHER)
Admission: EM | Admit: 2017-12-16 | Discharge: 2017-12-16 | Disposition: A | Discharge status: Home / Self Care | Payer: Self-pay | Attending: Emergency Medicine | Admitting: Emergency Medicine

## 2017-12-16 DIAGNOSIS — J3489 Other specified disorders of nose and nasal sinuses: Secondary | ICD-10-CM | POA: Insufficient documentation

## 2017-12-16 DIAGNOSIS — F17200 Nicotine dependence, unspecified, uncomplicated: Secondary | ICD-10-CM | POA: Insufficient documentation

## 2017-12-16 DIAGNOSIS — M791 Myalgia, unspecified site: Secondary | ICD-10-CM | POA: Insufficient documentation

## 2017-12-16 DIAGNOSIS — B349 Viral infection, unspecified: Secondary | ICD-10-CM | POA: Insufficient documentation

## 2017-12-16 DIAGNOSIS — R0981 Nasal congestion: Secondary | ICD-10-CM | POA: Insufficient documentation

## 2017-12-16 DIAGNOSIS — R05 Cough: Secondary | ICD-10-CM | POA: Insufficient documentation

## 2017-12-16 DIAGNOSIS — Z79899 Other long term (current) drug therapy: Secondary | ICD-10-CM | POA: Insufficient documentation

## 2017-12-16 DIAGNOSIS — R109 Unspecified abdominal pain: Secondary | ICD-10-CM | POA: Insufficient documentation

## 2017-12-16 DIAGNOSIS — R197 Diarrhea, unspecified: Secondary | ICD-10-CM | POA: Insufficient documentation

## 2017-12-16 DIAGNOSIS — R5383 Other fatigue: Secondary | ICD-10-CM | POA: Insufficient documentation

## 2017-12-16 DIAGNOSIS — R509 Fever, unspecified: Secondary | ICD-10-CM | POA: Insufficient documentation

## 2017-12-16 LAB — COMPREHENSIVE METABOLIC PANEL
ALBUMIN: 4.9 g/dL (ref 3.5–5.0)
ALK PHOS: 57 U/L (ref 38–126)
ALT: 31 U/L (ref 14–54)
ANION GAP: 12 (ref 5–15)
AST: 33 U/L (ref 15–41)
BILIRUBIN TOTAL: 0.7 mg/dL (ref 0.3–1.2)
BUN: 14 mg/dL (ref 6–20)
CALCIUM: 9.5 mg/dL (ref 8.9–10.3)
CO2: 25 mmol/L (ref 22–32)
CREATININE: 0.64 mg/dL (ref 0.44–1.00)
Chloride: 101 mmol/L (ref 101–111)
GFR calc Af Amer: >60 mL/min (ref >60)
GFR calc non Af Amer: >60 mL/min (ref >60)
GLUCOSE: 112 mg/dL — AB (ref 65–99)
Potassium: 3.9 mmol/L (ref 3.5–5.1)
SODIUM: 138 mmol/L (ref 135–145)
TOTAL PROTEIN: 9.1 g/dL — AB (ref 6.5–8.1)

## 2017-12-16 LAB — CBC WITH DIFFERENTIAL/PLATELET
BASOS PCT: 0 %
Basophils Absolute: 0 10*3/uL (ref 0.0–0.1)
EOS ABS: 0 10*3/uL (ref 0.0–0.7)
EOS PCT: 0 %
HCT: 36.1 % (ref 36.0–46.0)
Hemoglobin: 11.6 g/dL — ABNORMAL LOW (ref 12.0–15.0)
LYMPHS ABS: 1.2 10*3/uL (ref 0.7–4.0)
Lymphocytes Relative: 19 %
MCH: 24.3 pg — AB (ref 26.0–34.0)
MCHC: 32.1 g/dL (ref 30.0–36.0)
MCV: 75.5 fL — ABNORMAL LOW (ref 78.0–100.0)
MONO ABS: 1 10*3/uL (ref 0.1–1.0)
MONOS PCT: 15 %
NEUTROS PCT: 66 %
Neutro Abs: 4.2 10*3/uL (ref 1.7–7.7)
PLATELETS: 356 10*3/uL (ref 150–400)
RBC: 4.78 MIL/uL (ref 3.87–5.11)
RDW: 15.6 % — AB (ref 11.5–15.5)
WBC: 6.4 10*3/uL (ref 4.0–10.5)

## 2017-12-16 LAB — URINALYSIS, ROUTINE W REFLEX MICROSCOPIC

## 2017-12-16 LAB — URINALYSIS, MICROSCOPIC (REFLEX)

## 2017-12-16 LAB — PREGNANCY, URINE: PREG TEST UR: NEGATIVE

## 2017-12-16 LAB — LIPASE, BLOOD: Lipase: 20 U/L (ref 11–51)

## 2017-12-16 MED ORDER — SODIUM CHLORIDE 0.9 % IV SOLN: INTRAVENOUS | Status: DC

## 2017-12-16 MED ORDER — DICYCLOMINE HCL 20 MG PO TABS: 20.0000 mg | ORAL_TABLET | Freq: Three times a day (TID) | ORAL | 0 refills | Status: AC

## 2017-12-16 MED ORDER — ONDANSETRON HCL 4 MG/2ML IJ SOLN
4.0000 mg | Freq: Once | INTRAMUSCULAR | Status: AC
  Administered 2017-12-16: 4 mg via INTRAVENOUS
  Filled 2017-12-16: qty 2

## 2017-12-16 MED ORDER — ONDANSETRON 4 MG PO TBDP: 4.0000 mg | ORAL_TABLET | Freq: Three times a day (TID) | ORAL | 0 refills | Status: DC | PRN

## 2017-12-16 MED ORDER — SODIUM CHLORIDE 0.9 % IV BOLUS (SEPSIS)
1000.0000 mL | Freq: Once | INTRAVENOUS | Status: AC
  Administered 2017-12-16: 1000 mL via INTRAVENOUS

## 2017-12-16 MED ORDER — FAMOTIDINE IN NACL 20-0.9 MG/50ML-% IV SOLN
20.0000 mg | Freq: Once | INTRAVENOUS | Status: AC
  Administered 2017-12-16: 20 mg via INTRAVENOUS
  Filled 2017-12-16: qty 50

## 2017-12-16 NOTE — ED Notes (Addendum)
Pt unable to provide urine sample at this time. Pt states she will attempt again shortly.

## 2017-12-16 NOTE — ED Notes (Signed)
ED Provider at bedside. 

## 2017-12-16 NOTE — ED Notes (Signed)
Pt given Sprite to drink as a fluid challenge per RN instruction.

## 2017-12-16 NOTE — ED Triage Notes (Signed)
Patient states that she has had N/V/D since about Friday.

## 2017-12-16 NOTE — ED Notes (Addendum)
Pt unable to give UA at this time 

## 2017-12-16 NOTE — Discharge Instructions (Signed)
You were seen in the emergency department and diagnosed with a viral illness.  Your lab work did not show signs of infection or electrolyte abnormality.  Your lab work did show anemia consistent with previous anemia.  You were given fluids, Zofran, and Pepcid.  We have given you prescription for Zofran as well as Bentyl.  Bentyl as an antispasmodic that will help with your abdominal cramping this once every 8 hours as needed.  With your primary care provide 1 week for reevaluation.  Return to the emergency department for new or worsening symptoms including but not limited to blood in your vomit or diarrhea, fever not improved with Motrin or Tylenol, severe abdominal pain, or any other concerns you may have

## 2017-12-16 NOTE — ED Provider Notes (Signed)
[facility] EMERGENCY DEPARTMENT Provider Note   CSN: [id number] Arrival date & time: 12/16/17  [id number]     History   Chief Complaint Chief Complaint  Patient presents with  . Abdominal Pain    HPI Tammy Patel is a 32 y.o. female with a hx of tobacco abuse who presents to the ED with N/V/D x 3 days. Patient states she has had multiple episodes of vomiting and diarrhea daily for the past 3 days. States diarrhea and emesis are nonbloody. States she has developed a diffuse crampy abdominal discomfort with the vomiting and diarrhea- worse with vomiting, no specific alleviating factors. She is also experiencing congestion, rhinorrhea, productive cough with white phlegm sputum, fever, chills, fatigue, and generalized myalgias. There are no alleviating/aggravating factors. Patient has had very minimal amount to eat or drink given she is having difficulty tolerating PO. Reports sick contacts with nephew and daughter- sick with similar sxs however their's resolved more quickly. States received influenza vaccine this year. Denies chest pain, dyspnea, or palpitations. Patient is currently menstruating.   HPI  History reviewed. No pertinent past medical history.  There are no active problems to display for this patient.   Past Surgical History:  Procedure Laterality Date  . CESAREAN SECTION      OB History    Gravida Para Term Preterm AB Living   4 4       4    SAB TAB Ectopic Multiple Live Births                   Home Medications    Prior to Admission medications   Medication Sig Start Date End Date Taking? Authorizing Provider  acetaminophen (TYLENOL) 500 MG tablet Take 2 tablets (1,000 mg total) by mouth every 6 (six) hours as needed. Patient not taking: Reported on 01/30/2017 06/21/16   Marquette SaaLancaster, Abigail Joseph, MD  bismuth subsalicylate (PEPTO-BISMOL) 262 MG chewable tablet Chew 2 tablets (524 mg total) by mouth as needed for diarrhea/loose stools. Patient not taking:  Reported on 01/30/2017 06/21/16   Marquette SaaLancaster, Abigail Joseph, MD  famotidine (PEPCID) 20 MG tablet Take 20 mg by mouth 2 (two) times daily.    [provider]  ibuprofen (ADVIL,MOTRIN) 800 MG tablet Take 1 tablet (800 mg total) by mouth every 6 (six) hours as needed. Patient not taking: Reported on 01/30/2017 06/21/16   Marquette SaaLancaster, Abigail Joseph, MD  ondansetron (ZOFRAN ODT) 4 MG disintegrating tablet Take 1 tablet (4 mg total) by mouth every 8 (eight) hours as needed for nausea or vomiting. Patient not taking: Reported on 01/30/2017 06/21/16   Marquette SaaLancaster, Abigail Joseph, MD  ondansetron [facility](ZOFRAN) 8 MG tablet Take 1 tablet (8 mg total) by mouth every 8 (eight) hours as needed for nausea or vomiting. 01/30/17   Mancel BaleWentz, Elliott, MD  promethazine (PHENERGAN) 25 MG tablet Take 25 mg by mouth every 6 (six) hours as needed for nausea or vomiting.    [provider]    Family History History reviewed. No pertinent family history.  Social History Social History   Tobacco Use  . Smoking status: Current Some Day Smoker  . Smokeless tobacco: Never Used  Substance Use Topics  . Alcohol use: No  . Drug use: No     Allergies   Patient has no known allergies.   Review of Systems Review of Systems  Constitutional: Positive for appetite change (decreased), chills, fatigue and fever (yesterday- 101).  HENT: Positive for congestion, rhinorrhea and sore throat (mild). Negative for ear  pain.   Respiratory: Positive for cough (white phlegm). Negative for shortness of breath.   Cardiovascular: Negative for chest pain and leg swelling.  Gastrointestinal: Positive for abdominal pain, diarrhea, nausea and vomiting. Negative for blood in stool and constipation.  Genitourinary: Positive for vaginal bleeding (currently menstruating). Negative for dysuria, hematuria, urgency and vaginal discharge.  Musculoskeletal: Positive for myalgias (generalized).  All other systems reviewed and are  negative.   Physical Exam Updated Vital Signs BP (!) 147/98 (BP Location: Left Arm)   Pulse 93   Temp 99 F (37.2 C) (Oral)   Resp 18   Ht 5' (1.524 m)   Wt 52.2 kg (115 lb)   LMP 12/16/2017   SpO2 100%   BMI 22.46 kg/m   Physical Exam  Constitutional: She appears well-developed and well-nourished.  Non-toxic appearance. No distress.  HENT:  Head: Normocephalic and atraumatic.  Right Ear: Tympanic membrane is not perforated, not erythematous, not retracted and not bulging.  Left Ear: Tympanic membrane is not perforated, not erythematous, not retracted and not bulging.  Nose: Mucosal edema present. Right sinus exhibits no maxillary sinus tenderness and no frontal sinus tenderness. Left sinus exhibits no maxillary sinus tenderness and no frontal sinus tenderness.  Mouth/Throat: Uvula is midline. Mucous membranes are dry. No oropharyngeal exudate or posterior oropharyngeal erythema.  Eyes: Conjunctivae are normal. Pupils are equal, round, and reactive to light. Right eye exhibits no discharge. Left eye exhibits no discharge.  Neck: Normal range of motion. Neck supple.  Cardiovascular: Normal rate and regular rhythm.  No murmur heard. Pulmonary/Chest: Breath sounds normal. No respiratory distress. She has no wheezes. She has no rales.  Abdominal: Soft. She exhibits no distension. There is tenderness (mild diffuse, nonfocal). There is no rigidity, no rebound, no guarding, no CVA tenderness and no tenderness at McBurney's point.  Lymphadenopathy:    She has no cervical adenopathy.  Neurological: She is alert.  Skin: Skin is warm and dry. No rash noted.  Psychiatric: She has a normal mood and affect. Her behavior is normal.  Nursing note and vitals reviewed.   ED Treatments / Results  Labs Results for orders placed or performed during the hospital encounter of 12/16/17  Comprehensive metabolic panel  Result Value Ref Range   Sodium 138 135 - 145 mmol/L   Potassium 3.9 3.5 - 5.1  mmol/L   Chloride 101 101 - 111 mmol/L   CO2 25 22 - 32 mmol/L   Glucose, Bld 112 (H) 65 - 99 mg/dL   BUN 14 6 - 20 mg/dL   Creatinine, Ser 4.09 0.44 - 1.00 mg/dL   Calcium 9.5 8.9 - 81.1 mg/dL   Total Protein 9.1 (H) 6.5 - 8.1 g/dL   Albumin 4.9 3.5 - 5.0 g/dL   AST 33 15 - 41 U/L   ALT 31 14 - 54 U/L   Alkaline Phosphatase 57 38 - 126 U/L   Total Bilirubin 0.7 0.3 - 1.2 mg/dL   GFR calc non Af Amer >60 >60 mL/min   GFR calc Af Amer >60 >60 mL/min   Anion gap 12 5 - 15  Lipase, blood  Result Value Ref Range   Lipase 20 11 - 51 U/L  CBC with Diff  Result Value Ref Range   WBC 6.4 4.0 - 10.5 K/uL   RBC 4.78 3.87 - 5.11 MIL/uL   Hemoglobin 11.6 (L) 12.0 - 15.0 g/dL   HCT 91.4 78.2 - 95.6 %   MCV 75.5 (L) 78.0 - 100.0 fL  MCH 24.3 (L) 26.0 - 34.0 pg   MCHC 32.1 30.0 - 36.0 g/dL   RDW 69.6 (H) 29.5 - 28.4 %   Platelets 356 150 - 400 K/uL   Neutrophils Relative % 66 %   Neutro Abs 4.2 1.7 - 7.7 K/uL   Lymphocytes Relative 19 %   Lymphs Abs 1.2 0.7 - 4.0 K/uL   Monocytes Relative 15 %   Monocytes Absolute 1.0 0.1 - 1.0 K/uL   Eosinophils Relative 0 %   Eosinophils Absolute 0.0 0.0 - 0.7 K/uL   Basophils Relative 0 %   Basophils Absolute 0.0 0.0 - 0.1 K/uL  Urinalysis, Routine w reflex microscopic  Result Value Ref Range   Color, Urine RED (A) YELLOW   APPearance TURBID (A) CLEAR   Specific Gravity, Urine  1.005 - 1.030    TEST NOT REPORTED DUE TO COLOR INTERFERENCE OF URINE PIGMENT   pH  5.0 - 8.0    TEST NOT REPORTED DUE TO COLOR INTERFERENCE OF URINE PIGMENT   Glucose, UA (A) NEGATIVE mg/dL    TEST NOT REPORTED DUE TO COLOR INTERFERENCE OF URINE PIGMENT   Hgb urine dipstick (A) NEGATIVE    TEST NOT REPORTED DUE TO COLOR INTERFERENCE OF URINE PIGMENT   Bilirubin Urine (A) NEGATIVE    TEST NOT REPORTED DUE TO COLOR INTERFERENCE OF URINE PIGMENT   Ketones, ur (A) NEGATIVE mg/dL    TEST NOT REPORTED DUE TO COLOR INTERFERENCE OF URINE PIGMENT   Protein, ur (A) NEGATIVE  mg/dL    TEST NOT REPORTED DUE TO COLOR INTERFERENCE OF URINE PIGMENT   Nitrite (A) NEGATIVE    TEST NOT REPORTED DUE TO COLOR INTERFERENCE OF URINE PIGMENT   Leukocytes, UA (A) NEGATIVE    TEST NOT REPORTED DUE TO COLOR INTERFERENCE OF URINE PIGMENT  Pregnancy, urine  Result Value Ref Range   Preg Test, Ur NEGATIVE NEGATIVE  Urinalysis, Microscopic (reflex)  Result Value Ref Range   RBC / HPF TOO NUMEROUS TO COUNT 0 - 5 RBC/hpf   WBC, UA 6-30 0 - 5 WBC/hpf   Bacteria, UA RARE (A) NONE SEEN   Squamous Epithelial / LPF 0-5 (A) NONE SEEN   No results found. EKG  EKG Interpretation None      Radiology No results found.  Procedures Procedures (including critical care time)  Medications Ordered in ED Medications  sodium chloride 0.9 % bolus 1,000 mL (0 mLs Intravenous Stopped 12/16/17 1104)  ondansetron (ZOFRAN) injection 4 mg (4 mg Intravenous Given 12/16/17 0952)  famotidine (PEPCID) IVPB 20 mg premix (0 mg Intravenous Stopped 12/16/17 1104)    Initial Impression / Assessment and Plan / ED Course  I have reviewed the triage vital signs and the nursing notes.  Pertinent labs & imaging results that were available during my care of the patient were reviewed by me and considered in my medical decision making (see chart for details).  Patient presents with influenza like illness. She is nontoxic appearing, in no apparent distress, vitals are WNL other than BP which initially was elevated and improved. On exam patient has very mild diffuse abdominal tenderness to palpation and dry mucous membranes. Will evaluate with screening labs and administer fluids, zofran, and pepcid.  Labs grossly unremarkable. Patient's 11.6 hgb is consistent and somewhat improved from her baseline anemia. Of note there is no leukocytosis, significant electrolyte abnormality, or significant abnormality with liver or kidney function. Lipase WNL. UA findings consistent with menstruation.   13:15: RE-EVAL: Patient  is  resting comfortably, states she feels much better, pain and nausea have resolved.   Patient is nontoxic, nonseptic appearing, in no apparent distress.  Patient's pain and other symptoms adequately managed in emergency department.  Fluid bolus given.  Labs and vitals reviewed.  Patient does not meet the SIRS or Sepsis criteria.  On repeat exam patient does not have a surgical abdomen and there are no peritoneal signs.  No indication of appendicitis, pancreatitis, bowel obstruction, bowel perforation, cholecystitis, diverticulitis, or ectopic pregnancy. Patient with additional URI sxs-  Patient is afebrile and without adventitious sounds on lung exam, doubt PNA. No wheezing on exam. Afebrile, no sinus tenderness, doubt sinusitis. Centor score 0, doubt strep pharyngitis. Suspect viral etiology to patient's overall symptoms. Will treat supportively with Zofran and Bentyl.  I discussed results, treatment plan, need for PCP follow-up, and return precautions with the patient. Provided opportunity for questions, patient confirmed understanding and is in agreement with plan.   Vitals:   12/16/17 0845 12/16/17 0846 12/16/17 1119 12/16/17 1257  BP: (!) 147/98  (!) 146/89 119/67  Pulse: 93  74 68  Resp: 18  18 16   Temp: 99 F (37.2 C)     TempSrc: Oral     SpO2: 100%  100% 100%  Weight:  52.2 kg (115 lb)    Height:  5' (1.524 m)      Final Clinical Impressions(s) / ED Diagnoses   Final diagnoses:  Viral illness    ED Discharge Orders        Ordered    dicyclomine (BENTYL) 20 MG tablet  Every 8 hours     12/16/17 1318    ondansetron (ZOFRAN ODT) 4 MG disintegrating tablet  Every 8 hours PRN     12/16/17 [address] [place] R, PA-C 12/16/17 1733    Alvira Monday, MD 12/16/17 2311

## 2017-12-19 ENCOUNTER — Other Ambulatory Visit: Payer: Self-pay

## 2017-12-19 ENCOUNTER — Emergency Department (HOSPITAL_BASED_OUTPATIENT_CLINIC_OR_DEPARTMENT_OTHER)
Admission: EM | Admit: 2017-12-19 | Discharge: 2017-12-19 | Disposition: A | Discharge status: Home / Self Care | Payer: Self-pay | Attending: Emergency Medicine | Admitting: Emergency Medicine

## 2017-12-19 ENCOUNTER — Encounter (HOSPITAL_BASED_OUTPATIENT_CLINIC_OR_DEPARTMENT_OTHER): Payer: Self-pay | Admitting: Emergency Medicine

## 2017-12-19 DIAGNOSIS — Z79899 Other long term (current) drug therapy: Secondary | ICD-10-CM | POA: Insufficient documentation

## 2017-12-19 DIAGNOSIS — F17210 Nicotine dependence, cigarettes, uncomplicated: Secondary | ICD-10-CM | POA: Insufficient documentation

## 2017-12-19 DIAGNOSIS — K529 Noninfective gastroenteritis and colitis, unspecified: Secondary | ICD-10-CM | POA: Insufficient documentation

## 2017-12-19 LAB — COMPREHENSIVE METABOLIC PANEL
ALK PHOS: 49 U/L (ref 38–126)
ALT: 26 U/L (ref 14–54)
AST: 27 U/L (ref 15–41)
Albumin: 4.7 g/dL (ref 3.5–5.0)
Anion gap: 13 (ref 5–15)
BUN: 15 mg/dL (ref 6–20)
CO2: 25 mmol/L (ref 22–32)
Calcium: 9.4 mg/dL (ref 8.9–10.3)
Chloride: 97 mmol/L — ABNORMAL LOW (ref 101–111)
Creatinine, Ser: 0.68 mg/dL (ref 0.44–1.00)
Glucose, Bld: 105 mg/dL — ABNORMAL HIGH (ref 65–99)
Potassium: 3 mmol/L — ABNORMAL LOW (ref 3.5–5.1)
SODIUM: 135 mmol/L (ref 135–145)
Total Bilirubin: 1 mg/dL (ref 0.3–1.2)
Total Protein: 8.5 g/dL — ABNORMAL HIGH (ref 6.5–8.1)

## 2017-12-19 LAB — CBC
HCT: 38 % (ref 36.0–46.0)
Hemoglobin: 12.2 g/dL (ref 12.0–15.0)
MCH: 24.4 pg — AB (ref 26.0–34.0)
MCHC: 32.1 g/dL (ref 30.0–36.0)
MCV: 75.8 fL — ABNORMAL LOW (ref 78.0–100.0)
Platelets: 416 10*3/uL — ABNORMAL HIGH (ref 150–400)
RBC: 5.01 MIL/uL (ref 3.87–5.11)
RDW: 14.9 % (ref 11.5–15.5)
WBC: 6.2 10*3/uL (ref 4.0–10.5)

## 2017-12-19 LAB — URINALYSIS, ROUTINE W REFLEX MICROSCOPIC
Glucose, UA: NEGATIVE mg/dL
Leukocytes, UA: NEGATIVE
Nitrite: NEGATIVE
Protein, ur: >300 mg/dL — AB
Specific Gravity, Urine: 1.025 (ref 1.005–1.030)
pH: 6.5 (ref 5.0–8.0)

## 2017-12-19 LAB — URINALYSIS, MICROSCOPIC (REFLEX)

## 2017-12-19 LAB — PREGNANCY, URINE: PREG TEST UR: NEGATIVE

## 2017-12-19 LAB — LIPASE, BLOOD: Lipase: 22 U/L (ref 11–51)

## 2017-12-19 MED ORDER — SODIUM CHLORIDE 0.9 % IV BOLUS (SEPSIS)
1000.0000 mL | Freq: Once | INTRAVENOUS | Status: AC
  Administered 2017-12-19: 1000 mL via INTRAVENOUS

## 2017-12-19 MED ORDER — PROMETHAZINE HCL 25 MG/ML IJ SOLN
12.5000 mg | Freq: Once | INTRAMUSCULAR | Status: AC
  Administered 2017-12-19: 12.5 mg via INTRAVENOUS
  Filled 2017-12-19: qty 1

## 2017-12-19 MED ORDER — POTASSIUM CHLORIDE CRYS ER 20 MEQ PO TBCR
60.0000 meq | EXTENDED_RELEASE_TABLET | Freq: Once | ORAL | Status: AC
  Administered 2017-12-19: 60 meq via ORAL
  Filled 2017-12-19: qty 3

## 2017-12-19 MED ORDER — PROMETHAZINE HCL 12.5 MG PO TABS: 12.5000 mg | ORAL_TABLET | Freq: Four times a day (QID) | ORAL | 0 refills | Status: DC | PRN

## 2017-12-19 MED FILL — PROMETHAZINE 12.5 MG TABLET: 12.5 | 7 days supply | Qty: 30 | Fill #0

## 2017-12-19 NOTE — Discharge Instructions (Signed)
Ms. Sharol HarnessSimmons,  Bonita QuinYou are likely still having symptoms from gastroenteritis. It can take a week or so to feel better. Please try phenergan for your nausea. Start with just 1 tablet (12.5 mg), but you can take 25 mg at a time. It is most important that you try to drink fluids regularly. You may want to try probiotic capsules to improve the balance of the good and back bacteria of your gut after having this illness. Return if you are unable to keep anything down despite medication.

## 2017-12-19 NOTE — ED Provider Notes (Signed)
[facility] EMERGENCY DEPARTMENT Provider Note   CSN: [id number] Arrival date & time: 12/19/17  0809     History   Chief Complaint Chief Complaint  Patient presents with  . Emesis    HPI Tammy DurandJennifer Cambria is a 32 y.o. female who presents for nausea and abdominal pain. She was seen for similar on 12/16/17  HPI Patient has had about a week of nausea, vomiting, diarrhea and abdominal pain. She says symptoms started after taking care of her daughter who had about 12 hours of similar symptoms. She says she has not been able to eat or drink anything since last ED visit when discharged with zofran. She denies ongoing fevers. Stools are becoming more formed. Only 2 episodes of diarrhea yesterday. She is concerned that she is still having symptoms. No recent sexual activity or change in vaginal discharge.   History reviewed. No pertinent past medical history.  There are no active problems to display for this patient.   Past Surgical History:  Procedure Laterality Date  . CESAREAN SECTION      OB History    Gravida Para Term Preterm AB Living   4 4       4    SAB TAB Ectopic Multiple Live Births                   Home Medications    Prior to Admission medications   Medication Sig Start Date End Date Taking? Authorizing Provider  dicyclomine (BENTYL) 20 MG tablet Take 1 tablet (20 mg total) by mouth every 8 (eight) hours. 12/16/17   Petrucelli, Samantha R, PA-C  famotidine (PEPCID) 20 MG tablet Take 20 mg by mouth 2 (two) times daily.    [provider]  ondansetron (ZOFRAN ODT) 4 MG disintegrating tablet Take 1 tablet (4 mg total) by mouth every 8 (eight) hours as needed for nausea or vomiting. 12/16/17   Petrucelli, Samantha R, PA-C  ondansetron (ZOFRAN) 8 MG tablet Take 1 tablet (8 mg total) by mouth every 8 (eight) hours as needed for nausea or vomiting. 01/30/17   Mancel BaleWentz, Elliott, MD  promethazine (PHENERGAN) 12.5 MG tablet Take 1 tablet (12.5 mg total) by mouth  every 6 (six) hours as needed for nausea or vomiting. 12/19/17   Casey BurkittFitzgerald, Arieon Scalzo Moen, MD    Family History No family history on file.  Social History Social History   Tobacco Use  . Smoking status: Current Some Day Smoker  . Smokeless tobacco: Never Used  Substance Use Topics  . Alcohol use: No  . Drug use: No     Allergies   Patient has no known allergies.   Review of Systems Review of Systems  Constitutional: Positive for appetite change.  HENT: Negative for congestion and rhinorrhea.   Respiratory: Negative for cough and shortness of breath.   Gastrointestinal: Positive for abdominal pain, diarrhea and nausea. Negative for blood in stool and vomiting.  Genitourinary: Negative for dysuria, frequency and vaginal pain.  Musculoskeletal: Negative for back pain.  Skin: Negative for rash.  Neurological: Positive for weakness.     Physical Exam Updated Vital Signs BP (!) 142/102 (BP Location: Right Arm)   Pulse 81   Temp 98.7 F (37.1 C) (Oral)   Resp 18   Ht 5' (1.524 m)   Wt 52.2 kg (115 lb)   LMP 12/16/2017   SpO2 100%   BMI 22.46 kg/m   Physical Exam  Constitutional: She is oriented to person, place, and time. She appears  well-developed and well-nourished. No distress.  HENT:  Head: Normocephalic and atraumatic.  Mouth/Throat: Oropharynx is clear and moist.  Eyes: EOM are normal. Pupils are equal, round, and reactive to light.  Neck: Normal range of motion.  Cardiovascular: Normal rate, regular rhythm and normal heart sounds.  No murmur heard. Pulmonary/Chest: Effort normal and breath sounds normal. No respiratory distress.  Abdominal: Soft. Bowel sounds are normal. There is tenderness (To LLQ but nontender when distracted.).  Musculoskeletal: Normal range of motion.  Neurological: She is alert and oriented to person, place, and time.  Skin: Skin is warm and dry.  Psychiatric: She has a normal mood and affect.  Vitals reviewed.    ED Treatments /  Results  Labs (all labs ordered are listed, but only abnormal results are displayed) Labs Reviewed  COMPREHENSIVE METABOLIC PANEL - Abnormal; Notable for the following components:      Result Value   Potassium 3.0 (*)    Chloride 97 (*)    Glucose, Bld 105 (*)    Total Protein 8.5 (*)    All other components within normal limits  CBC - Abnormal; Notable for the following components:   MCV 75.8 (*)    MCH 24.4 (*)    Platelets 416 (*)    All other components within normal limits  URINALYSIS, ROUTINE W REFLEX MICROSCOPIC - Abnormal; Notable for the following components:   Hgb urine dipstick SMALL (*)    Bilirubin Urine MODERATE (*)    Ketones, ur >80 (*)    Protein, ur >300 (*)    All other components within normal limits  URINALYSIS, MICROSCOPIC (REFLEX) - Abnormal; Notable for the following components:   Bacteria, UA RARE (*)    Squamous Epithelial / LPF 0-5 (*)    All other components within normal limits  LIPASE, BLOOD  PREGNANCY, URINE    EKG  EKG Interpretation None       Radiology No results found.  Procedures Procedures (including critical care time)  Medications Ordered in ED Medications  sodium chloride 0.9 % bolus 1,000 mL (0 mLs Intravenous Stopped 12/19/17 1052)  promethazine (PHENERGAN) injection 12.5 mg (12.5 mg Intravenous Given 12/19/17 0938)  potassium chloride SA (K-DUR,KLOR-CON) CR tablet 60 mEq (60 mEq Oral Given 12/19/17 0937)     Initial Impression / Assessment and Plan / ED Course  I have reviewed the triage vital signs and the nursing notes.  Pertinent labs & imaging results that were available during my care of the patient were reviewed by me and considered in my medical decision making (see chart for details).  Phenergan given.  Kdur given for low potassium. Patient able to keep this down along with fluids. UA with bilirubin but CMP with normal bilirubin and LFTs. Negative lipase. Upreg neg.  No leukocytosis.   Final Clinical  Impressions(s) / ED Diagnoses   Final diagnoses:  Gastroenteritis   Patient able to tolerate PO after phenergan. Suspect continued symptoms from gastroenteritis. Discussed trying probiotics. Discharged with prescription for phenergan. Nonsurgical abdomen and normal vital signs so did not pursue imaging.   ED Discharge Orders        Ordered    promethazine (PHENERGAN) 12.5 MG tablet  Every 6 hours PRN     12/19/17 1046     Dani Gobble, MD [facility], PGY-3    Casey Burkitt, MD 12/19/17 1637    Little, Ambrose Finland, MD 12/21/17 [phone]

## 2017-12-19 NOTE — ED Triage Notes (Signed)
Vomiting, chills, headache since Friday. Seen here Sunday for same. Given rx for zofran. States symptoms persist.

## 2020-03-28 ENCOUNTER — Other Ambulatory Visit: Payer: Self-pay

## 2020-03-28 DIAGNOSIS — Z79899 Other long term (current) drug therapy: Secondary | ICD-10-CM | POA: Insufficient documentation

## 2020-03-28 DIAGNOSIS — R112 Nausea with vomiting, unspecified: Secondary | ICD-10-CM | POA: Insufficient documentation

## 2020-03-28 DIAGNOSIS — Z720 Tobacco use: Secondary | ICD-10-CM | POA: Insufficient documentation

## 2020-03-29 ENCOUNTER — Encounter (HOSPITAL_BASED_OUTPATIENT_CLINIC_OR_DEPARTMENT_OTHER): Payer: Self-pay | Admitting: Emergency Medicine

## 2020-03-29 ENCOUNTER — Other Ambulatory Visit: Payer: Self-pay

## 2020-03-29 ENCOUNTER — Emergency Department (HOSPITAL_BASED_OUTPATIENT_CLINIC_OR_DEPARTMENT_OTHER)
Admission: EM | Admit: 2020-03-29 | Discharge: 2020-03-29 | Disposition: A | Discharge status: Home / Self Care | Payer: Self-pay | Attending: Emergency Medicine | Admitting: Emergency Medicine

## 2020-03-29 DIAGNOSIS — R112 Nausea with vomiting, unspecified: Secondary | ICD-10-CM

## 2020-03-29 LAB — URINALYSIS, ROUTINE W REFLEX MICROSCOPIC
Bilirubin Urine: NEGATIVE
Glucose, UA: NEGATIVE mg/dL
Ketones, ur: 40 mg/dL — AB
Leukocytes,Ua: NEGATIVE
Nitrite: NEGATIVE
Protein, ur: >300 mg/dL — AB
Specific Gravity, Urine: >1.03 — ABNORMAL HIGH (ref 1.005–1.030)
pH: 6 (ref 5.0–8.0)

## 2020-03-29 LAB — COMPREHENSIVE METABOLIC PANEL
ALT: 25 U/L (ref 0–44)
AST: 28 U/L (ref 15–41)
Albumin: 5.1 g/dL — ABNORMAL HIGH (ref 3.5–5.0)
Alkaline Phosphatase: 50 U/L (ref 38–126)
Anion gap: 14 (ref 5–15)
BUN: 19 mg/dL (ref 6–20)
CO2: 25 mmol/L (ref 22–32)
Calcium: 9.4 mg/dL (ref 8.9–10.3)
Chloride: 103 mmol/L (ref 98–111)
Creatinine, Ser: 0.59 mg/dL (ref 0.44–1.00)
GFR calc Af Amer: >60 mL/min (ref >60)
GFR calc non Af Amer: >60 mL/min (ref >60)
Glucose, Bld: 120 mg/dL — ABNORMAL HIGH (ref 70–99)
Potassium: 3.6 mmol/L (ref 3.5–5.1)
Sodium: 142 mmol/L (ref 135–145)
Total Bilirubin: 1.1 mg/dL (ref 0.3–1.2)
Total Protein: 8.7 g/dL — ABNORMAL HIGH (ref 6.5–8.1)

## 2020-03-29 LAB — URINALYSIS, MICROSCOPIC (REFLEX): RBC / HPF: >50 RBC/hpf (ref 0–5)

## 2020-03-29 LAB — CBC
HCT: 35.3 % — ABNORMAL LOW (ref 36.0–46.0)
Hemoglobin: 11.2 g/dL — ABNORMAL LOW (ref 12.0–15.0)
MCH: 23.7 pg — ABNORMAL LOW (ref 26.0–34.0)
MCHC: 31.7 g/dL (ref 30.0–36.0)
MCV: 74.6 fL — ABNORMAL LOW (ref 80.0–100.0)
Platelets: 427 10*3/uL — ABNORMAL HIGH (ref 150–400)
RBC: 4.73 MIL/uL (ref 3.87–5.11)
RDW: 16.3 % — ABNORMAL HIGH (ref 11.5–15.5)
WBC: 12.9 10*3/uL — ABNORMAL HIGH (ref 4.0–10.5)
nRBC: 0 % (ref 0.0–0.2)

## 2020-03-29 LAB — PREGNANCY, URINE: Preg Test, Ur: NEGATIVE

## 2020-03-29 LAB — LIPASE, BLOOD: Lipase: 21 U/L (ref 11–51)

## 2020-03-29 MED ORDER — ONDANSETRON HCL 4 MG/2ML IJ SOLN
4.0000 mg | Freq: Once | INTRAMUSCULAR | Status: AC | PRN
  Administered 2020-03-29: 4 mg via INTRAVENOUS
  Filled 2020-03-29: qty 2

## 2020-03-29 MED ORDER — SODIUM CHLORIDE 0.9 % IV BOLUS
1000.0000 mL | Freq: Once | INTRAVENOUS | Status: AC
  Administered 2020-03-29: 1000 mL via INTRAVENOUS

## 2020-03-29 MED ORDER — SODIUM CHLORIDE 0.9% FLUSH
3.0000 mL | Freq: Once | INTRAVENOUS | Status: AC
  Administered 2020-03-29: 3 mL via INTRAVENOUS
  Filled 2020-03-29: qty 3

## 2020-03-29 MED ORDER — ONDANSETRON 4 MG PO TBDP: 4.0000 mg | ORAL_TABLET | Freq: Three times a day (TID) | ORAL | 0 refills | Status: AC | PRN

## 2020-03-29 MED ORDER — OMEPRAZOLE 20 MG PO CPDR
20.0000 mg | DELAYED_RELEASE_CAPSULE | Freq: Every day | ORAL | 0 refills | Status: AC
Start: 2020-03-29 — End: ?

## 2020-03-29 MED ORDER — ONDANSETRON HCL 4 MG/2ML IJ SOLN
4.0000 mg | Freq: Once | INTRAMUSCULAR | Status: AC
  Administered 2020-03-29: 4 mg via INTRAVENOUS
  Filled 2020-03-29: qty 2

## 2020-03-29 NOTE — Discharge Instructions (Addendum)
You were seen today for nausea and vomiting.  Your lab testing is largely reassuring.  You do have some signs of dehydration.  Take Zofran as needed for nausea.  Start a clear liquid diet.  It is important that you follow-up with gastroenterology as previously scheduled.

## 2020-03-29 NOTE — ED Notes (Signed)
Tolerated po challenge

## 2020-03-29 NOTE — ED Triage Notes (Addendum)
Reports n/v and feeling weak for 2 days.  Hx of the same.  Later in triage reported having a syncopal episode Saturday night.  Went to [facility] but left without being seen.

## 2020-03-29 NOTE — ED Notes (Signed)
Report received 

## 2020-03-29 NOTE — ED Provider Notes (Signed)
[facility] EMERGENCY DEPARTMENT Provider Note   CSN: [id number] Arrival date & time: 03/28/20  2346     History Chief Complaint  Patient presents with  . Emesis    Tammy Patel is a 34 y.o. female.  HPI     This is a 34 year old female who presents with nausea and vomiting.  Patient reports similar recent history of nausea, vomiting.  No diarrhea.  Patient states that she felt so weak that she thinks she passed out on Saturday night.  She reports associated crampy abdominal pain.  She denies any drug use, specifically marijuana use.  She does not believe that she is pregnant.  She is currently on her menstrual period.  Denies any fevers or recent upper respiratory symptoms.  Denies any known sick contacts or Covid exposure.  She has not taken anything for her symptoms.  She does state that she previously saw a GI specialist and "I think I was supposed to have a procedure."  But she cannot tell me what that was.  Currently she rates her discomfort at 8 out of 10.  Patient seen and evaluated on 5/6 for similar symptoms.  She had a CT scan and pelvic ultrasound at [facility] which were reassuring.  She was given Bentyl, PPI and follow-up.  History reviewed. No pertinent past medical history.  There are no problems to display for this patient.   Past Surgical History:  Procedure Laterality Date  . CESAREAN SECTION       OB History    Gravida  4   Para  4   Term      Preterm      AB      Living  4     SAB      TAB      Ectopic      Multiple      Live Births              No family history on file.  Social History   Tobacco Use  . Smoking status: Current Some Day Smoker  . Smokeless tobacco: Never Used  Substance Use Topics  . Alcohol use: No  . Drug use: No    Home Medications Prior to Admission medications   Medication Sig Start Date End Date Taking? Authorizing Provider  famotidine (PEPCID) 20 MG tablet Take 20 mg by mouth 2  (two) times daily.   Yes [provider]  dicyclomine (BENTYL) 20 MG tablet Take 1 tablet (20 mg total) by mouth every 8 (eight) hours. 12/16/17   Petrucelli, Samantha R, PA-C  omeprazole (PRILOSEC) 20 MG capsule Take 1 capsule (20 mg total) by mouth daily. 03/29/20   Arjen Deringer, Mayer Masker, MD  ondansetron (ZOFRAN ODT) 4 MG disintegrating tablet Take 1 tablet (4 mg total) by mouth every 8 (eight) hours as needed. 03/29/20   Terasa Orsini, Mayer Masker, MD  ondansetron (ZOFRAN) 8 MG tablet Take 1 tablet (8 mg total) by mouth every 8 (eight) hours as needed for nausea or vomiting. 01/30/17   Mancel Bale, MD  promethazine (PHENERGAN) 12.5 MG tablet Take 1 tablet (12.5 mg total) by mouth every 6 (six) hours as needed for nausea or vomiting. 12/19/17   Casey Burkitt, MD    Allergies    Patient has no known allergies.  Review of Systems   Review of Systems  Constitutional: Negative for fever.  Respiratory: Negative for shortness of breath.   Cardiovascular: Negative for chest pain.  Gastrointestinal: Positive  for abdominal pain, nausea and vomiting. Negative for blood in stool, constipation and diarrhea.  Genitourinary: Negative for dysuria.  Musculoskeletal: Negative for back pain.  Neurological: Positive for light-headedness.  All other systems reviewed and are negative.   Physical Exam Updated Vital Signs BP (!) 157/89 (BP Location: Right Arm)   Pulse 72   Temp 98.6 F (37 C) (Oral)   Resp 18   Ht 1.524 m (5')   Wt 57.6 kg   LMP 03/29/2020   SpO2 100%   BMI 24.80 kg/m   Physical Exam Vitals and nursing note reviewed.  Constitutional:      Appearance: She is well-developed. She is not ill-appearing.  HENT:     Head: Normocephalic and atraumatic.     Nose: Nose normal.     Mouth/Throat:     Mouth: Mucous membranes are moist.  Eyes:     Pupils: Pupils are equal, round, and reactive to light.  Cardiovascular:     Rate and Rhythm: Normal rate and regular rhythm.      Heart sounds: Normal heart sounds.  Pulmonary:     Effort: Pulmonary effort is normal. No respiratory distress.     Breath sounds: No wheezing.  Abdominal:     General: Bowel sounds are normal.     Palpations: Abdomen is soft.     Comments: Generalized tenderness palpation, no rebound or guarding, no distention  Musculoskeletal:     Cervical back: Neck supple.     Right lower leg: No edema.     Left lower leg: No edema.  Skin:    General: Skin is warm and dry.  Neurological:     Mental Status: She is alert and oriented to person, place, and time.  Psychiatric:        Mood and Affect: Mood normal.     ED Results / Procedures / Treatments   Labs (all labs ordered are listed, but only abnormal results are displayed) Labs Reviewed  COMPREHENSIVE METABOLIC PANEL - Abnormal; Notable for the following components:      Result Value   Glucose, Bld 120 (*)    Total Protein 8.7 (*)    Albumin 5.1 (*)    All other components within normal limits  CBC - Abnormal; Notable for the following components:   WBC 12.9 (*)    Hemoglobin 11.2 (*)    HCT 35.3 (*)    MCV 74.6 (*)    MCH 23.7 (*)    RDW 16.3 (*)    Platelets 427 (*)    All other components within normal limits  URINALYSIS, ROUTINE W REFLEX MICROSCOPIC - Abnormal; Notable for the following components:   APPearance TURBID (*)    Specific Gravity, Urine >1.030 (*)    Hgb urine dipstick LARGE (*)    Ketones, ur 40 (*)    Protein, ur >300 (*)    All other components within normal limits  URINALYSIS, MICROSCOPIC (REFLEX) - Abnormal; Notable for the following components:   Bacteria, UA FEW (*)    All other components within normal limits  LIPASE, BLOOD  PREGNANCY, URINE    EKG None  Radiology No results found.  Procedures Procedures (including critical care time)  Medications Ordered in ED Medications  sodium chloride flush (NS) 0.9 % injection 3 mL (3 mLs Intravenous Given 03/29/20 0133)  ondansetron (ZOFRAN)  injection 4 mg (4 mg Intravenous Given 03/29/20 0027)  sodium chloride 0.9 % bolus 1,000 mL (0 mLs Intravenous Stopped 03/29/20 0254)  ondansetron (  ZOFRAN) injection 4 mg (4 mg Intravenous Given 03/29/20 0129)    ED Course  I have reviewed the triage vital signs and the nursing notes.  Pertinent labs & imaging results that were available during my care of the patient were reviewed by me and considered in my medical decision making (see chart for details).    MDM Rules/Calculators/A&P                       Patient presents with nausea and vomiting.  Recent history of the same.  Reports she was supposed to see a gastroenterologist for procedure but has not.  She is overall nontoxic and vital signs are reassuring.  She has some generalized tenderness on exam but no signs of peritonitis.  Recent negative work-up including pelvic ultrasound and CT scan.  Will repeat lab work.  Patient was given pain and nausea medication.  Considerations include but not limited to gastritis, gastroenteritis, pancreatitis, cholecystitis, SBO.  Lab work reviewed.  Patient is not pregnant.  She does have some evidence of dehydration with some ketones in her urine.  LFTs and lipase are reassuring.  She has a mild leukocytosis to 12.9 which is nonspecific.  Patient did have one episode of emesis following p.o. challenge but she is otherwise able to tolerate fluids.  Will discharge with Zofran and omeprazole.  Patient instructed to follow-up with GI as previously referred.  After history, exam, and medical workup I feel the patient has been appropriately medically screened and is safe for discharge home. Pertinent diagnoses were discussed with the patient. Patient was given return precautions.   Final Clinical Impression(s) / ED Diagnoses Final diagnoses:  Nausea and vomiting, intractability of vomiting not specified, unspecified vomiting type    Rx / DC Orders ED Discharge Orders         Ordered    ondansetron  (ZOFRAN ODT) 4 MG disintegrating tablet  Every 8 hours PRN     03/29/20 0351    omeprazole (PRILOSEC) 20 MG capsule  Daily     03/29/20 0351           Shon Baton, MD 03/29/20 [phone]

## 2020-03-29 NOTE — ED Notes (Signed)
Sprite and crackers given  

## 2024-05-24 NOTE — ED Provider Notes (Signed)
   SHARED VISIT NOTE:   This patient was seen and evaluated by me after a full presentation from Isaiah Merles, PA-C.  Physical Exam  BP 131/76 (BP Location: Left arm, Patient Position: Lying)   Pulse 95   Temp 99.3 F (37.4 C) (Oral)   Resp 18   Ht 162.6 cm (5' 4)   Wt 48.5 kg (107 lb)   SpO2 100%   BMI 18.37 kg/m   Physical Exam Vitals and nursing note reviewed.  Constitutional:      General: She is not in acute distress.    Appearance: She is not toxic-appearing or diaphoretic.  HENT:     Head: Normocephalic and atraumatic.     Right Ear: External ear normal.     Left Ear: External ear normal.     Nose: Nose normal.     Mouth/Throat:     Mouth: Mucous membranes are dry.     Pharynx: Oropharynx is clear.   Eyes:     Extraocular Movements: Extraocular movements intact.     Conjunctiva/sclera: Conjunctivae normal.     Pupils: Pupils are equal, round, and reactive to light.    Cardiovascular:     Rate and Rhythm: Normal rate and regular rhythm.     Pulses: Normal pulses.     Heart sounds: Normal heart sounds.  Pulmonary:     Effort: Pulmonary effort is normal. No respiratory distress.     Breath sounds: Normal breath sounds. No stridor. No wheezing, rhonchi or rales.  Abdominal:     General: Abdomen is flat. Bowel sounds are normal. There is no distension.     Palpations: Abdomen is soft.     Tenderness: There is abdominal tenderness. There is no guarding or rebound.   Musculoskeletal:        General: No signs of injury.     Cervical back: Normal range of motion. No rigidity or tenderness.     Right lower leg: No edema.     Left lower leg: No edema.   Skin:    General: Skin is warm and dry.     Capillary Refill: Capillary refill takes less than 2 seconds.     Findings: No rash.   Neurological:     General: No focal deficit present.     Mental Status: She is alert and oriented to person, place, and time. Mental status is at baseline.     Sensory: No  sensory deficit.     Motor: No weakness.      Assessment & Plan  Tammy Patel is a 38 y.o. female who presented with abdominal pain, nausea and vomiting.  Based on ED workup, findings are consistent with dehydration and associated metabolic abnormalities in the setting of likely viral gastrointestinal illness.  Patient will be placed in CDU for further symptomatic management.  ED Clinical Impression  1. Nausea and vomiting, unspecified vomiting type   2. Dehydration     ED Disposition  ED Disposition     ED Disposition  Observation   Condition  --   Comment  --         _____________________________

## 2024-05-24 NOTE — ED Triage Notes (Signed)
 Vomiting, shakes, dizziness that began last night.  Urinary hesitancy

## 2024-05-24 NOTE — ED Provider Notes (Signed)
 ------------------------------------------------------------------------------- Attestation signed by William Spang Zackowski, DO at 05/25/2024  9:29 AM Patient was seen after full presentation from [facility], PA-C.  Please see my independent note documenting my face-to-face encounter with physical exam, assessment and plan.  My independent note should serve as my attestation.  -------------------------------------------------------------------------------  Patient placed in First Look pathway, seen and evaluated for chief complaint of vomiting dizziness and shakes since last night.  Denies fevers.  ROS positive for abdominal pain, urinary frequency.   Pertinent exam findings include non-toxic in appearance and speech is clear. Based on initial evaluation, labs are currently indicated and radiology studies are not currently indicated as allowed for current processes and treatments as applicable in a triage setting and could be different than if patient were seen in a main treatment area or dependent on labs/imagining after results are displayed.  Patient counseled on process, plan, and necessity for staying for completing the evaluation.   This document serves as a record of services personally performed by Charley Herlene RIGGERS  [facility] Emergency Department Provider Note  This document was created using the aid of voice recognition Dragon dictation software.   Provider at bedside: 05/24/2024 7:09 PM  History obtained from the: Patient  History   Chief Complaint  Patient presents with  . Emesis    HPI  Ane Conerly is a 38 y.o. female who presents to the ED with complaints of abdominal pain and vomiting.  Patient reports that since yesterday afternoon she has had vomiting.  She reports that she has abdominal pain which began after her vomiting started.  She reports she has vomited at least 10 times since yesterday.  She reports that while she is  vomiting she will feel dizzy.  She reports that she does feel shaky.  She denies any fevers.  She denies any headaches, chest pain, shortness of breath or diarrhea.  She is unsure if she has had any blood in her stool.  She denies eating any undercooked meat.  She reports that she had a hot dog prior to her symptoms starting.  She reports that she has a appointment to get surgery on her lower abdomen next month but she is unsure as to what the surgery is actually for.  She reports that she has had the symptoms previously and had a CT scan and was sent home.  No LMP recorded. ______________________ ROS: Pertinent positives and negatives per HPI.  Past Medical History Reviewed in chart  Past Surgical History Reviewed in chart  Medications These were reviewed. See nursing note for details.  Allergies Reviewed in chart  Family History Reviewed in chart  Social History Reviewed in chart  All pertinent family history, social history and PMHx including medical, surgical, current medications and allergies were reviewed by myself. See nursing note for details.   Physical Exam  I have reviewed the following vital signs:  Vitals:   05/24/24 1504 05/24/24 2012 05/24/24 2100  BP: 157/86 132/60 146/87  BP Location: Right arm  Left arm  Patient Position: Sitting  Sitting  Pulse:  79 73  Resp: 18  16  Temp: 98 F (36.7 C)  99.8 F (37.7 C)  TempSrc: Oral  Oral  SpO2: 97% 100% 100%  Weight: 48.5 kg (107 lb)    Height: 162.6 cm (5' 4)      Physical Exam Constitutional:      General: She is not in acute distress.    Appearance: She is ill-appearing. She  is not toxic-appearing.  HENT:     Head: Normocephalic and atraumatic.     Mouth/Throat:     Mouth: Mucous membranes are dry.     Pharynx: No oropharyngeal exudate or posterior oropharyngeal erythema.   Eyes:     Extraocular Movements: Extraocular movements intact.     Pupils: Pupils are equal, round, and reactive to light.     Cardiovascular:     Rate and Rhythm: Normal rate and regular rhythm.     Heart sounds: No murmur heard.    No friction rub. No gallop.  Pulmonary:     Effort: Pulmonary effort is normal. No respiratory distress.  Abdominal:     General: Abdomen is flat. There is no distension.     Palpations: Abdomen is soft.     Tenderness: There is abdominal tenderness. There is no right CVA tenderness, left CVA tenderness, guarding or rebound.   Musculoskeletal:     Cervical back: Normal range of motion and neck supple.   Skin:    General: Skin is warm and dry.     Capillary Refill: Capillary refill takes less than 2 seconds.   Neurological:     General: No focal deficit present.     Mental Status: She is alert and oriented to person, place, and time.   Psychiatric:        Mood and Affect: Mood normal.        Behavior: Behavior normal.       EKG  My interpretation of EKG is as follows:  Rate: 74 Rhythm: normal sinus rhythm Axis: normal Intervals: Normal ST-T Waves: Normal ST-T Waves Comparison with Old: unchanged from EKG from 12/15/22  Procedure Note  Procedures  Medical Decision Making  Discussed options for workup with patient including risks and benefits via shared decision making and decided to proceed with workup.  ED Course ED Course as of 05/24/24 2105  Sat May 24, 2024  2050 Hemoglobin(!): 8.1 Hemoglobin 8.1.  Not below 7 to require emergent transfusion.  Based on previous CBCs hemoglobin has had a steady decline.  Most recent CBC from 2 months ago and hemoglobin of 9.  Hemoccult is negative. [AO]  2055 My interpretation of labs-mild leukocytosis with white blood cells of 11.  Chronic anemia.  No thrombocytosis or thrombocytopenia.  No electrolyte abnormalities require intervention no evidence of AKI.  Urine does not look infected however does look like she may be dehydrated.  Will start fluids. [AO]  2056 CT Abdomen Pelvis W Contrast CT abdomen pelvis not show any  acute intra-abdominal pathologies. [AO]  2056 Intervention-patient given fluids Toradol  and Zofran .  She continues to have positive despite these interventions.  Offered CDU for observation and reevaluation in the morning and she accepts. [AO]    ED Course User Index [AO] Isaiah Carlyon Merles, PA-C    Initial Differential Diagnoses: I have considered the following in determining workup and treatments: SBO, appendicitis, cholecystitis, gastritis, PUD, kidney stone, hernia, ETOH, drug abuse, ingestion, vertigo, increased intracranial pressure, ICH, tumor, concussion, infection, dehydration, DKA, hyponatremia   Labs/Imaging/other testing ordered due to concerns discussed in history of and physical exam include: cbc, cmp, ua mic, CT abdomen and pelvis w contrast, and lipase  I considered ordering ultrasound right upper quadrant but after history/exam/other workup, do not feel this is indicated at this time.  Medical Decision Making Amount and/or Complexity of Data Reviewed Labs: ordered.  Risk Prescription drug management.    Labs Reviewed: See ED course  Imaging  Reviewed: See ED course   Discussed management or test interpretation with other qualified healthcare professional: The following consultations were required: none  Clinical Complexity  Number and complexity of problems addressed: Patient's presentation is most consistent with acute presentation with potential threat to life or bodily function..  Decision to admit or increased level of care requiring transfer: Pt has been accepted to CDU for continued observation to determine need for further treatment and potential admission    Discussed options for workup with patient including risks and benefits via shared decision making and decided to proceed with workup.  Evidence based calculators if applicable:     Medical Record Review: I reviewed medical records available. Specifically, I reviewed the ED note from  4/28/5 where patient was evaluated for abdominal pain nausea vomiting weakness and lightheadedness.  Patient had a reassuring workup and CT scan was normal.  Provider time spent in patient care today, inclusive of but not limited to clinical reassessment, review of diagnostic studies, and discharge preparation, was greater than 30 minutes.   ED Assessment and Plan   Pt is a 38 y.o. female who presented with nausea and vomiting for more than a day.  On physical exam patient does appear dehydrated.  She has mild abdomen tenderness to palpation without any focalization.  Lungs clear to auscultation no murmurs gallops rubs on cardiac exam.  Labs showing worsening anemia however Hemoccult is negative.  No electrolyte abnormalities requiring intervention.  CT scan does not show any acute findings.  Patient continues to vomit despite fluids and Zofran .  I offered CDU for continued observation and reevaluation in the morning.  She accepts.  Patient placed in the CDU for observation overnight and reevaluation in the morning. History, physical exam, labs and imaging results are noted above. Patient in agreement with plan, all questions answered to satisfaction.  ED Clinical Impression   1. Nausea and vomiting, unspecified vomiting type   2. Dehydration     ED Disposition     ED Disposition  Observation   Condition  --   Comment  --         Current Rx ordered in [facility][1]   FOLLOW UP No follow-up provider specified. ____________________________       [1] Current Facility-Administered Medications Ordered in [facility]  Medication Dose Route Frequency Provider Last Rate Last Admin  . acetaminophen  (TYLENOL ) tablet 1,000 mg  1,000 mg oral RQ6H PRN Allison Paige Oneill, PA-C      . alum-mag hydroxide-simethicone (MAALOX, MYLANTA) 200-200-20 mg/5 mL suspension 15 mL  15 mL oral RQ6H PRN Isaiah Lapine Oneill, PA-C      . magnesium sulfate 2g in sterile water 50 mL IVPB  2 g intravenous Once  Allison Paige Oneill, PA-C      . ondansetron  (ZOFRAN ) injection 4 mg  4 mg intravenous Q6H PRN Allison Paige Oneill, PA-C      . sodium chloride  0.9 % infusion  75 mL/hr intravenous Continuous Isaiah Lapine Merles, PA-C       Meds Ordered in [facility]  Medication Sig Dispense Refill  . ondansetron  (ZOFRAN -ODT) 4 mg disintegrating tablet Take 4 mg by mouth every 8 (eight) hours as needed. 20 tablet 0  . pantoprazole (PROTONIX) 40 mg EC tablet Take 40 mg by mouth Once Daily for 30 days. 30 tablet 0  . potassium chloride  (KLOR-CON ) 20 mEq ER tablet Take 20 mEq by mouth 2 (two) times a day for 10 days. 20 tablet 0  . sucralfate (CARAFATE) 100 mg/mL  oral suspension Take 1 g by mouth 4 (four) times a day as needed. 420 mL 0

## 2024-05-25 ENCOUNTER — Emergency Department (HOSPITAL_COMMUNITY)
Admission: EM | Admit: 2024-05-25 | Discharge: 2024-05-26 | Disposition: A | Discharge status: Home / Self Care | Attending: Emergency Medicine | Admitting: Emergency Medicine

## 2024-05-25 DIAGNOSIS — F17290 Nicotine dependence, other tobacco product, uncomplicated: Secondary | ICD-10-CM | POA: Insufficient documentation

## 2024-05-25 DIAGNOSIS — R1013 Epigastric pain: Secondary | ICD-10-CM | POA: Diagnosis not present

## 2024-05-25 DIAGNOSIS — R109 Unspecified abdominal pain: Secondary | ICD-10-CM | POA: Diagnosis present

## 2024-05-25 DIAGNOSIS — R112 Nausea with vomiting, unspecified: Secondary | ICD-10-CM | POA: Insufficient documentation

## 2024-05-25 DIAGNOSIS — I10 Essential (primary) hypertension: Secondary | ICD-10-CM | POA: Diagnosis not present

## 2024-05-26 ENCOUNTER — Other Ambulatory Visit: Payer: Self-pay

## 2024-05-26 ENCOUNTER — Emergency Department (HOSPITAL_COMMUNITY)

## 2024-05-26 ENCOUNTER — Encounter (HOSPITAL_COMMUNITY): Payer: Self-pay | Admitting: *Deleted

## 2024-05-26 LAB — COMPREHENSIVE METABOLIC PANEL WITH GFR
ALT: 20 U/L (ref 0–44)
AST: 25 U/L (ref 15–41)
Albumin: 4.6 g/dL (ref 3.5–5.0)
Alkaline Phosphatase: 51 U/L (ref 38–126)
Anion gap: 13 (ref 5–15)
BUN: 14 mg/dL (ref 6–20)
CO2: 20 mmol/L — ABNORMAL LOW (ref 22–32)
Calcium: 9.4 mg/dL (ref 8.9–10.3)
Chloride: 104 mmol/L (ref 98–111)
Creatinine, Ser: 0.6 mg/dL (ref 0.44–1.00)
GFR, Estimated: >60 mL/min (ref >60)
Glucose, Bld: 105 mg/dL — ABNORMAL HIGH (ref 70–99)
Potassium: 3.8 mmol/L (ref 3.5–5.1)
Sodium: 137 mmol/L (ref 135–145)
Total Bilirubin: 1.4 mg/dL — ABNORMAL HIGH (ref 0.0–1.2)
Total Protein: 8.1 g/dL (ref 6.5–8.1)

## 2024-05-26 LAB — URINALYSIS, ROUTINE W REFLEX MICROSCOPIC
Bilirubin Urine: NEGATIVE
Glucose, UA: NEGATIVE mg/dL
Ketones, ur: 20 mg/dL — AB
Leukocytes,Ua: NEGATIVE
Nitrite: NEGATIVE
Protein, ur: 100 mg/dL — AB
RBC / HPF: >50 RBC/hpf (ref 0–5)
Specific Gravity, Urine: >1.046 — ABNORMAL HIGH (ref 1.005–1.030)
pH: 6 (ref 5.0–8.0)

## 2024-05-26 LAB — CBC
HCT: 32.3 % — ABNORMAL LOW (ref 36.0–46.0)
Hemoglobin: 8.8 g/dL — ABNORMAL LOW (ref 12.0–15.0)
MCH: 18.2 pg — ABNORMAL LOW (ref 26.0–34.0)
MCHC: 27.2 g/dL — ABNORMAL LOW (ref 30.0–36.0)
MCV: 66.7 fL — ABNORMAL LOW (ref 80.0–100.0)
Platelets: 464 K/uL — ABNORMAL HIGH (ref 150–400)
RBC: 4.84 MIL/uL (ref 3.87–5.11)
RDW: 17.7 % — ABNORMAL HIGH (ref 11.5–15.5)
WBC: 8 K/uL (ref 4.0–10.5)
nRBC: 0 % (ref 0.0–0.2)

## 2024-05-26 LAB — HCG, SERUM, QUALITATIVE: Preg, Serum: NEGATIVE

## 2024-05-26 LAB — LIPASE, BLOOD: Lipase: 26 U/L (ref 11–51)

## 2024-05-26 MED ORDER — PROMETHAZINE HCL 25 MG RE SUPP: 25.0000 mg | Freq: Four times a day (QID) | RECTAL | 0 refills | Status: AC | PRN

## 2024-05-26 MED ORDER — MORPHINE SULFATE (PF) 4 MG/ML IV SOLN
4.0000 mg | Freq: Once | INTRAVENOUS | Status: AC
  Administered 2024-05-26: 4 mg via INTRAVENOUS
  Filled 2024-05-26: qty 1

## 2024-05-26 MED ORDER — ONDANSETRON HCL 4 MG/2ML IJ SOLN
4.0000 mg | Freq: Once | INTRAMUSCULAR | Status: AC
  Administered 2024-05-26: 4 mg via INTRAVENOUS
  Filled 2024-05-26: qty 2

## 2024-05-26 MED ORDER — SODIUM CHLORIDE 0.9 % IV BOLUS
1000.0000 mL | Freq: Once | INTRAVENOUS | Status: AC
  Administered 2024-05-26: 1000 mL via INTRAVENOUS

## 2024-05-26 MED ORDER — IOHEXOL 350 MG/ML SOLN
100.0000 mL | Freq: Once | INTRAVENOUS | Status: AC | PRN
  Administered 2024-05-26: 100 mL via INTRAVENOUS

## 2024-05-26 MED ORDER — ONDANSETRON 4 MG PO TBDP
8.0000 mg | ORAL_TABLET | Freq: Once | ORAL | Status: DC
  Filled 2024-05-26: qty 2

## 2024-05-26 NOTE — ED Provider Notes (Signed)
  Physical Exam  BP (!) 142/95 (BP Location: Left Arm)   Pulse 89   Temp 98 F (36.7 C) (Oral)   Resp 18   Ht 5' (1.524 m)   Wt 57.6 kg   LMP 05/26/2024   SpO2 100%   BMI 24.80 kg/m   Physical Exam  Procedures  Procedures  ED Course / MDM    Medical Decision Making Amount and/or Complexity of Data Reviewed Radiology: ordered.  Risk Prescription drug management.   Medical Decision Making:   Care handed off from previous provider, L.McCauley, PA-C.  Tammy Patel is a 38 y.o. female who presented to the ED today with nausea vomiting abdominal pain, extensive workup undertaken prior to assumption of care includes CT angiography to rule out aortic dissection.  This was negative.  Noted on review of labs that hemoglobin is 8.8.  This patient was previously assessed at [facility] and was advised on the same, managed with outpatient prescription of ferrous sulfate.  At time of assumption of care, plan was to reevaluate after administration of 1 L of normal saline, and provide oral fluid challenge.  If patient can tolerate oral fluids, plan discharge with outpatient follow-up.  Reassessment and Plan:   After reevaluating patient, she subjectively states that she is improved in her condition, and has tolerated oral fluids.  Requests to have food at this point, this was provided to the patient and she tolerated without difficulty.  As such, plan to discharge with outpatient prescription for Phenergan  suppositories as previously noted, she already has outpatient appointment with GI in the next week, otherwise follow-up with primary care to reassess her hemoglobin and need for continued iron therapy.  This was thoroughly discussed with the patient, she understands and agrees has no further concerns at this time.         Tammy Patel, Tammy Patel 05/26/24 9195    Dasie Faden, MD 05/27/24 [phone]

## 2024-05-26 NOTE — ED Notes (Signed)
 Patient transported to CT

## 2024-05-26 NOTE — ED Notes (Signed)
 Pt ambulated to the bathroom.

## 2024-05-26 NOTE — ED Notes (Addendum)
Patient able to tolerate PO fluids

## 2024-05-26 NOTE — ED Provider Notes (Signed)
 [place] EMERGENCY DEPARTMENT AT [facility] Provider Note   CSN: [id number] Arrival date & time: 05/25/24  2327     Patient presents with: Abdominal Pain and Hypertension   Tammy Patel is a 38 y.o. female.  Patient with no relevant past medical history on file presents to the emergency room complaining of nausea, vomiting, abdominal pain.  Patient at the time of my assessment reports 10 out of 10 abdominal pain with radiation from the epigastric region to the back.  She tells me that she was supposed to have abdominal surgery, or possibly a GI procedure in 2020 which was postponed due to COVID.  She has not followed up since that time.  She has had multiple episodes of abdominal pain since that time.  She was most recently seen this weekend at an outside emergency department where she was observed overnight due to similar symptoms but without the radiation to her back.  The patient also complains of hypertension today.  She states she was able to tolerate oral intake prior to discharge from the hospital Sunday morning.  The patient denies shortness of breath, chest pain, fevers, urinary symptoms, vaginal discharge.    Abdominal Pain Hypertension Associated symptoms include abdominal pain.       Prior to Admission medications   Medication Sig Start Date End Date Taking? Authorizing Provider  promethazine  (PHENERGAN ) 25 MG suppository Place 1 suppository (25 mg total) rectally every 6 (six) hours as needed for nausea or vomiting. 05/26/24  Yes Logan Ubaldo NOVAK, PA-C  dicyclomine  (BENTYL ) 20 MG tablet Take 1 tablet (20 mg total) by mouth every 8 (eight) hours. 12/16/17   Petrucelli, Samantha R, PA-C  famotidine  (PEPCID ) 20 MG tablet Take 20 mg by mouth 2 (two) times daily.    [provider]  omeprazole  (PRILOSEC) 20 MG capsule Take 1 capsule (20 mg total) by mouth daily. 03/29/20   Horton, Charmaine FALCON, MD  ondansetron  (ZOFRAN  ODT) 4 MG disintegrating tablet Take 1  tablet (4 mg total) by mouth every 8 (eight) hours as needed. 03/29/20   Horton, Charmaine FALCON, MD  ondansetron  (ZOFRAN ) 8 MG tablet Take 1 tablet (8 mg total) by mouth every 8 (eight) hours as needed for nausea or vomiting. 01/30/17   Lorriane Holmes, MD    Allergies: Patient has no known allergies.    Review of Systems  Gastrointestinal:  Positive for abdominal pain.    Updated Vital Signs BP (!) 142/95 (BP Location: Left Arm)   Pulse 89   Temp 98 F (36.7 C) (Oral)   Resp 18   Ht 5' (1.524 m)   Wt 57.6 kg   LMP 05/26/2024   SpO2 100%   BMI 24.80 kg/m   Physical Exam Vitals and nursing note reviewed.  Constitutional:      Appearance: She is well-developed.  HENT:     Head: Normocephalic and atraumatic.  Eyes:     Conjunctiva/sclera: Conjunctivae normal.  Cardiovascular:     Rate and Rhythm: Normal rate and regular rhythm.     Pulses:          Radial pulses are 2+ on the right side and 2+ on the left side.  Pulmonary:     Effort: Pulmonary effort is normal. No respiratory distress.     Breath sounds: Normal breath sounds.  Abdominal:     Palpations: Abdomen is soft.     Tenderness: There is generalized abdominal tenderness.  Musculoskeletal:        General:  No swelling.     Cervical back: Neck supple.  Skin:    General: Skin is warm and dry.     Capillary Refill: Capillary refill takes less than 2 seconds.  Neurological:     Mental Status: She is alert.  Psychiatric:        Mood and Affect: Mood normal.     (all labs ordered are listed, but only abnormal results are displayed) Labs Reviewed  COMPREHENSIVE METABOLIC PANEL WITH GFR - Abnormal; Notable for the following components:      Result Value   CO2 20 (*)    Glucose, Bld 105 (*)    Total Bilirubin 1.4 (*)    All other components within normal limits  CBC - Abnormal; Notable for the following components:   Hemoglobin 8.8 (*)    HCT 32.3 (*)    MCV 66.7 (*)    MCH 18.2 (*)    MCHC 27.2 (*)    RDW 17.7  (*)    Platelets 464 (*)    All other components within normal limits  LIPASE, BLOOD  HCG, SERUM, QUALITATIVE  URINALYSIS, ROUTINE W REFLEX MICROSCOPIC    EKG: None  Radiology: CT Angio Chest/Abd/Pel for Dissection W and/or Wo Contrast Result Date: 05/26/2024 CLINICAL DATA:  Abdominal pain, nausea, vomiting and diarrhea with hypertension. We have been asked to evaluate for acute aortic syndrome as etiology. EXAM: CT ANGIOGRAPHY CHEST, ABDOMEN AND PELVIS TECHNIQUE: Non-contrast CT of the chest was initially obtained. Multidetector CT imaging through the chest, abdomen and pelvis was performed using the standard protocol during bolus administration of intravenous contrast. Multiplanar reconstructed images and MIPs were obtained and reviewed to evaluate the vascular anatomy. RADIATION DOSE REDUCTION: This exam was performed according to the departmental dose-optimization program which includes automated exposure control, adjustment of the mA and/or kV according to patient size and/or use of iterative reconstruction technique. CONTRAST:  OMNIPAQUE  IOHEXOL  350 MG/ML SOLN COMPARISON:  Last chest x-ray was portable chest 12/15/2022. Last abdomen and pelvis CT with contrast was 2 days ago 05/24/2024, before that CT with IV contrast 03/10/2024. Last CTA chest was 12/14/2022. FINDINGS: CTA CHEST FINDINGS Cardiovascular: The pulmonary arteries actually opacify more than the aorta and branch arteries. No arterial embolism is seen, no arterial dilatation. The cardiac size is normal. There are no visible coronary calcifications. No significant pericardial fluid. Pulmonary veins are nondilated. The aorta and great vessels are normal with no aneurysm, stenosis or dissection, or visible plaques. There is a normal variant 2 vessel aortic arch with a brachiobicarotid trunk. Mediastinum/Nodes: No enlarged mediastinal, hilar, or axillary lymph nodes. Thyroid gland, trachea, and esophagus demonstrate no significant  findings. Lungs/Pleura: There is mild centrilobular emphysema in the upper lobes, mild reticular scarring at the lung apices. Central bronchi are slightly thickened but no bronchiectasis or bronchial impactions noted. There is no consolidation, effusion or pneumothorax. No pulmonary nodules are seen. Musculoskeletal: No chest wall abnormality. No acute or significant osseous findings. Review of the MIP images confirms the above findings. CTA ABDOMEN AND PELVIS FINDINGS VASCULAR Aorta: Normal. Celiac: Normal.  No branch occlusion. SMA: Normal. No branch occlusion. The angle of the SMA with the aorta is only 17 degrees in this patient which can predispose to SMA compression syndrome. In addition the aortomesenteric distance where the duodenum crosses between them measures only 7 mm. Renals: 2 arteries supply each kidney. On the left, there is a very small artery arising first and supplying a portion of the extreme upper pole.  The dominant artery arises just after this and supplies the remainder. On the right, the dominant artery supplies the upper to mid pole and arises first. There is a smaller lower pole artery arising 4.5 cm below this from the right anterior aortic wall and supplying the inferior pole. All 4 arteries are normal and widely patent. No hilar branch occlusion is seen. IMA: Normal. Inflow: Patent without evidence of aneurysm, dissection, vasculitis or significant stenosis. Veins: No obvious venous abnormality within the limitations of this arterial phase study. Review of the MIP images confirms the above findings. NON-VASCULAR Hepatobiliary: No liver mass is seen. There is contrast in the gallbladder from the CT abdomen and pelvis from July 12. Gallbladder wall and lumen are unremarkable. There is no bile duct dilatation. Pancreas: No abnormality. Spleen: Normal in size without focal abnormality. Adrenals/Urinary Tract: Adrenal glands are unremarkable. Kidneys are normal, without renal calculi, focal  lesion, or hydronephrosis. Bladder is contracted and not well seen but there are no inflammatory changes around it. Stomach/Bowel: Unremarkable contracted stomach. There are thickened folds in distal duodenum and jejunum compatible with nonspecific enteritis but no inflammatory change. There is increased dilatation in first and second portions the duodenum to the level of the aorta, which may be seen with SMA compression syndrome. The proximal duodenal caliber is 2.9 cm. The appendix is normal caliber. There is either wall thickening or underdistention involving the transverse and descending colon. Correlate clinically for underlying colitis. Lymphatic: No adenopathy is seen. Reproductive: Moderate pelvic venous congestion. This was better seen on the recent prior study due to the phase of contrast enhancement. The uterus and ovaries are not enlarged. Other: Pelvic phleboliths. No free fluid or free air. No abdominal wall hernia. Musculoskeletal: No acute or significant osseous findings. Review of the MIP images confirms the above findings. IMPRESSION: 1. No evidence of aortic aneurysm or dissection.  Negative CTA exam. 2. No evidence of pulmonary arterial embolism. 3. Emphysema.  No focal infiltrate. 4. Central bronchial wall thickening without bronchiectasis or bronchial impactions. 5. Abnormal angle of the SMA with the aorta, and narrowed aortomesenteric distance, which can predispose to SMA compression syndrome. 6. Increased dilatation of the first and second portions of the duodenum to the level of the aorta, which may be seen with SMA compression. 7. Thickened folds in the distal duodenum and jejunum compatible with nonspecific enteritis. 8. Wall thickening versus underdistention involving the transverse and descending colon. Correlate clinically for underlying colitis. 9. Moderate pelvic venous congestion. 10. 2 arteries supply each kidney. Emphysema (ICD10-J43.9). Electronically Signed   By: Francis Quam  M.D.   On: 05/26/2024 05:55     Procedures   Medications Ordered in the ED  ondansetron  (ZOFRAN -ODT) disintegrating tablet 8 mg (8 mg Oral Not Given 05/26/24 0433)  morphine  (PF) 4 MG/ML injection 4 mg (4 mg Intravenous Given 05/26/24 0429)  ondansetron  (ZOFRAN ) injection 4 mg (4 mg Intravenous Given 05/26/24 0429)  iohexol  (OMNIPAQUE ) 350 MG/ML injection 100 mL (100 mLs Intravenous Contrast Given 05/26/24 0508)  sodium chloride  0.9 % bolus 1,000 mL (1,000 mLs Intravenous New Bag/Given 05/26/24 9386)                                    Medical Decision Making Amount and/or Complexity of Data Reviewed Radiology: ordered.  Risk Prescription drug management.   This patient presents to the ED for concern of abdominal pain, nausea, vomiting, this involves  an extensive number of treatment options, and is a complaint that carries with it a high risk of complications and morbidity.  The differential diagnosis includes cholecystitis, appendicitis, pancreatitis, gastroenteritis, cannabinoid hyperemesis syndrome, aortic dissection, others   Co morbidities / Chronic conditions that complicate the patient evaluation  Chronic abdominal pain   Additional history obtained:  Additional history obtained from EMR External records from outside source obtained and reviewed including outside emergency department notes, CT scan from 2 days ago   Lab Tests:  I Ordered, and personally interpreted labs.  The pertinent results include: Hemoglobin 8.8 (was 8.1 2 days ago)   Imaging Studies ordered:  I ordered imaging studies including CT chest abdomen pelvis dissection study I independently visualized and interpreted imaging which showed  1. No evidence of aortic aneurysm or dissection.  Negative CTA exam.  2. No evidence of pulmonary arterial embolism.  3. Emphysema.  No focal infiltrate.  4. Central bronchial wall thickening without bronchiectasis or  bronchial impactions.  5. Abnormal angle of  the SMA with the aorta, and narrowed  aortomesenteric distance, which can predispose to SMA compression  syndrome.  6. Increased dilatation of the first and second portions of the  duodenum to the level of the aorta, which may be seen with SMA  compression.  7. Thickened folds in the distal duodenum and jejunum compatible  with nonspecific enteritis.  8. Wall thickening versus underdistention involving the transverse  and descending colon. Correlate clinically for underlying colitis.  9. Moderate pelvic venous congestion.  10. 2 arteries supply each kidney.   I agree with the radiologist interpretation   Problem List / ED Course / Critical interventions / Medication management   I ordered medication including morphine , Zofran , saline Reevaluation of the patient after these medicines showed that the patient improved I have reviewed the patients home medicines and have made adjustments as needed   Social Determinants of Health:  Patient is an occasional tobacco smoker, has Medicaid for her primary insurance type   Test / Admission - Considered:  Patient with significant improvement in symptoms after Zofran .  Plan to treat with a saline bolus and p.o. challenge.  If patient passes p.o. challenge can likely discharge home with continued plans for outpatient follow-up with GI later this month.  CT findings overall nonspecific in relation to patient's current complaints, in particular no signs of dissection.  There was some wall thickening in the colon with concerns for colitis but the patient has no significant left-sided abdominal pain and is not complaining of diarrhea.  Do not feel presentation is consistent with colitis.  There is some possible SMA compression but patient symptoms have improved significantly.  She will need continued outpatient follow-up for further evaluation of this.  At this time I see no indication for admission or further emergent workup as long as patient is able  to pass p.o. challenge.  Plan to send prescription for Phenergan  suppositories in case patient has difficulty with oral antiemetics.  Patient care being signed out to oncoming provider, Dorn Pair.  If patient passes p.o. challenge patient is comfortable discharge home and outpatient follow-up.  Return precautions have been provided.      Final diagnoses:  Epigastric pain  Nausea and vomiting, unspecified vomiting type    ED Discharge Orders          Ordered    promethazine  (PHENERGAN ) 25 MG suppository  Every 6 hours PRN        05/26/24 0608  Logan Ubaldo KATHEE DEVONNA 05/26/24 0631    Raford Lenis, MD 05/26/24 [phone]

## 2024-05-26 NOTE — ED Triage Notes (Signed)
 The pt is c/o and pain  n and v  and diarrhea  for 3 days   last b m yesterday  lmp  today

## 2024-05-26 NOTE — ED Notes (Signed)
 Pt's visitor states Pt is now throwing up blood. Triage RN made aware.

## 2024-05-26 NOTE — ED Triage Notes (Signed)
 The pt was seen at [facility] yesterday  she repeats that she is no better

## 2024-05-26 NOTE — Discharge Instructions (Addendum)
 Your workup this morning was overall reassuring.  Have attached copies of your CT findings below.  I recommend following up with a primary care provider for further evaluation.  Be sure to keep your upcoming appointment with gastroenterology next week.  A prescription has been sent to the pharmacy for Phenergan  suppositories which may be used if you are unable to tolerate oral nausea medications. If you develop life-threatening symptoms please return to the emergency department.   CT findings: 1. No evidence of aortic aneurysm or dissection.  Negative CTA exam.  2. No evidence of pulmonary arterial embolism.  3. Emphysema.  No focal infiltrate.  4. Central bronchial wall thickening without bronchiectasis or  bronchial impactions.  5. Abnormal angle of the SMA with the aorta, and narrowed  aortomesenteric distance, which can predispose to SMA compression  syndrome.  6. Increased dilatation of the first and second portions of the  duodenum to the level of the aorta, which may be seen with SMA  compression.  7. Thickened folds in the distal duodenum and jejunum compatible  with nonspecific enteritis.  8. Wall thickening versus underdistention involving the transverse  and descending colon. Correlate clinically for underlying colitis.  9. Moderate pelvic venous congestion.  10. 2 arteries supply each kidney.    Emphysema

## 2024-05-26 NOTE — ED Notes (Signed)
 Pt is upset and crying in the lobby stating she feels faint. Vitals retaken and Triage RN made aware.
